# Patient Record
Sex: Female | Born: 1985
Health system: Southern US, Community
[De-identification: ages and names within clinical notes are randomized; demographics above are authoritative.]

## PROBLEM LIST (undated history)

## (undated) DIAGNOSIS — R519 Headache, unspecified: Secondary | ICD-10-CM

## (undated) DIAGNOSIS — F32A Depression, unspecified: Secondary | ICD-10-CM

## (undated) DIAGNOSIS — J449 Chronic obstructive pulmonary disease, unspecified: Secondary | ICD-10-CM

## (undated) DIAGNOSIS — J45909 Unspecified asthma, uncomplicated: Secondary | ICD-10-CM

## (undated) DIAGNOSIS — F329 Major depressive disorder, single episode, unspecified: Secondary | ICD-10-CM

## (undated) DIAGNOSIS — J4489 Other specified chronic obstructive pulmonary disease: Secondary | ICD-10-CM

## (undated) DIAGNOSIS — Z86718 Personal history of other venous thrombosis and embolism: Secondary | ICD-10-CM

## (undated) HISTORY — DX: Headache, unspecified: R51.9

## (undated) HISTORY — DX: Chronic obstructive pulmonary disease, unspecified: J44.9

## (undated) HISTORY — DX: Personal history of other venous thrombosis and embolism: Z86.718

## (undated) HISTORY — DX: Other specified chronic obstructive pulmonary disease: J44.89

## (undated) HISTORY — DX: Unspecified asthma, uncomplicated: J45.909

## (undated) HISTORY — DX: Depression, unspecified: F32.A

---
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
## 2018-10-02 ENCOUNTER — Ambulatory Visit (INDEPENDENT_AMBULATORY_CARE_PROVIDER_SITE_OTHER): Payer: 59 | Admitting: Family Medicine

## 2018-10-02 ENCOUNTER — Encounter: Payer: Self-pay | Admitting: Family Medicine

## 2018-10-02 ENCOUNTER — Other Ambulatory Visit: Payer: Self-pay

## 2018-10-02 DIAGNOSIS — J302 Other seasonal allergic rhinitis: Secondary | ICD-10-CM | POA: Insufficient documentation

## 2018-10-02 DIAGNOSIS — J4520 Mild intermittent asthma, uncomplicated: Secondary | ICD-10-CM | POA: Insufficient documentation

## 2018-10-02 DIAGNOSIS — Z86711 Personal history of pulmonary embolism: Secondary | ICD-10-CM | POA: Diagnosis not present

## 2018-10-02 DIAGNOSIS — Z7689 Persons encountering health services in other specified circumstances: Secondary | ICD-10-CM

## 2018-10-02 MED ORDER — FLUTICASONE PROPIONATE 50 MCG/ACT NA SUSP: 1.0000 | Freq: Every day | NASAL | 0 refills | Status: DC

## 2018-10-02 NOTE — Progress Notes (Signed)
Virtual Visit via Video Note  I connected with Taylor Mccann on 10/02/18 at  8:00 AM EDT by a video enabled telemedicine application and verified that I am speaking with the correct person using two identifiers.  Location patient: home Location provider:work or home office Persons participating in the virtual visit: patient, provider  I discussed the limitations of evaluation and management by telemedicine and the availability of in person appointments. The patient expressed understanding and agreed to proceed.   HPI: Pt is a 33 yo female with pmh sig for asthma, h/o PE, and allergies.  Pt seen at [facility] in [place].  H/o Asthma: -dx'd 2016 -Pt notes hearing her breathing more. Sounds more like in her nose and throat.  -Not having to use the albuterol that much.   -taking singulair for allergies.  -Denies rhinorrhea, sore throat, sinus pressure or pain -endorses intermittent coughing.  H/o PE: -in 2016 -happened after using nuvaring -completed anticoagulation therapy -had f/u in Feb to "check lungs"  H/o irregular menses -in 2017  -went 6 months, then 3 months without a period -now regular  Allergies:   PCN- happened as a child, unsure of rxn.  Past Surgical hx: c-section  Social Hx: Pt recently moved to [place].  Pt is working at [employer].  Getting a master's in Education and an associates in nursing.  Pt has a 34 yo, 34 yo, and 34 yo.  LMP: last month.   Pt denies EtOH, tobacco, or drug use.  ROS: See pertinent positives and negatives per HPI.  Family medical hx: Mom-schizophrenia, bipolar, breathing issues MGM-DM, HTN Oldest brother- preDM   No past medical history on file.  No family history on file.  No current outpatient medications on file.  EXAM:  VITALS per patient if applicable: RR between 39-76 bpm  GENERAL: alert, oriented, appears well and in no acute distress  HEENT: atraumatic, conjunctiva clear, no obvious  abnormalities on inspection of external nose and ears  NECK: normal movements of the head and neck  LUNGS: on inspection no signs of respiratory distress, breathing rate appears normal, no obvious gross SOB, gasping or wheezing  CV: no obvious cyanosis  MS: moves all visible extremities without noticeable abnormality  PSYCH/NEURO: pleasant and cooperative, no obvious depression or anxiety, speech and thought processing grossly intact  ASSESSMENT AND PLAN:  Discussed the following assessment and plan:  Mild intermittent asthma without complication  - Plan: continue albuterol inhaler prn  Seasonal allergies  -symptoms more like transmitted upper airway noises caused by postnasal drainage -continue singulair. -discussed saline nasal rinse, honey, warm fluids, steam, other nasal sprays to help with symptoms - Plan: fluticasone (FLONASE) 50 MCG/ACT nasal spray  Encounter to establish care  -We reviewed the PMH, PSH, FH, SH, Meds and Allergies. -We provided refills for any medications we will prescribe as needed. -We addressed current concerns per orders and patient instructions. -We have asked for records for pertinent exams, studies, vaccines and notes from previous providers. -We have advised patient to follow up per instructions below.  History of pulmonary embolus (PE)  -single occurrence after starting nuvaring -continue to monitor.   f/u prn  I discussed the assessment and treatment plan with the patient. The patient was provided an opportunity to ask questions and all were answered. The patient agreed with the plan and demonstrated an understanding of the instructions.   The patient was advised to call back or seek an in-person evaluation if the symptoms worsen or  if the condition fails to improve as anticipated.   Billie Ruddy, MD

## 2019-09-07 ENCOUNTER — Ambulatory Visit (INDEPENDENT_AMBULATORY_CARE_PROVIDER_SITE_OTHER): Payer: 59 | Admitting: Family Medicine

## 2019-09-07 ENCOUNTER — Encounter: Payer: Self-pay | Admitting: Family Medicine

## 2019-09-07 ENCOUNTER — Other Ambulatory Visit (HOSPITAL_COMMUNITY)
Admission: RE | Admit: 2019-09-07 | Discharge: 2019-09-07 | Disposition: A | Discharge status: Home / Self Care | Payer: 59 | Source: Ambulatory Visit | Attending: Family Medicine | Admitting: Family Medicine

## 2019-09-07 ENCOUNTER — Other Ambulatory Visit: Payer: Self-pay

## 2019-09-07 VITALS — BP 110/82 | HR 79 | Temp 97.8°F | Ht 67.0 in | Wt 261.0 lb

## 2019-09-07 DIAGNOSIS — R0989 Other specified symptoms and signs involving the circulatory and respiratory systems: Secondary | ICD-10-CM

## 2019-09-07 DIAGNOSIS — Z23 Encounter for immunization: Secondary | ICD-10-CM

## 2019-09-07 DIAGNOSIS — N926 Irregular menstruation, unspecified: Secondary | ICD-10-CM | POA: Diagnosis not present

## 2019-09-07 DIAGNOSIS — F32A Depression, unspecified: Secondary | ICD-10-CM

## 2019-09-07 DIAGNOSIS — F419 Anxiety disorder, unspecified: Secondary | ICD-10-CM

## 2019-09-07 DIAGNOSIS — Z0000 Encounter for general adult medical examination without abnormal findings: Secondary | ICD-10-CM | POA: Diagnosis not present

## 2019-09-07 DIAGNOSIS — F329 Major depressive disorder, single episode, unspecified: Secondary | ICD-10-CM

## 2019-09-07 DIAGNOSIS — Z13220 Encounter for screening for lipoid disorders: Secondary | ICD-10-CM

## 2019-09-07 DIAGNOSIS — Z124 Encounter for screening for malignant neoplasm of cervix: Secondary | ICD-10-CM

## 2019-09-07 LAB — CBC WITH DIFFERENTIAL/PLATELET
Basophils Absolute: 0 10*3/uL (ref 0.0–0.1)
Basophils Relative: 0.6 % (ref 0.0–3.0)
Eosinophils Absolute: 0.4 10*3/uL (ref 0.0–0.7)
Eosinophils Relative: 7.3 % — ABNORMAL HIGH (ref 0.0–5.0)
HCT: 38.9 % (ref 36.0–46.0)
Hemoglobin: 13.2 g/dL (ref 12.0–15.0)
Lymphocytes Relative: 25.2 % (ref 12.0–46.0)
Lymphs Abs: 1.4 10*3/uL (ref 0.7–4.0)
MCHC: 34 g/dL (ref 30.0–36.0)
MCV: 85.4 fl (ref 78.0–100.0)
Monocytes Absolute: 0.4 10*3/uL (ref 0.1–1.0)
Monocytes Relative: 6.8 % (ref 3.0–12.0)
Neutro Abs: 3.3 10*3/uL (ref 1.4–7.7)
Neutrophils Relative %: 60.1 % (ref 43.0–77.0)
Platelets: 235 10*3/uL (ref 150.0–400.0)
RBC: 4.55 Mil/uL (ref 3.87–5.11)
RDW: 13.3 % (ref 11.5–15.5)
WBC: 5.5 10*3/uL (ref 4.0–10.5)

## 2019-09-07 LAB — BASIC METABOLIC PANEL
BUN: 12 mg/dL (ref 6–23)
CO2: 28 mEq/L (ref 19–32)
Calcium: 9 mg/dL (ref 8.4–10.5)
Chloride: 104 mEq/L (ref 96–112)
Creatinine, Ser: 1.11 mg/dL (ref 0.40–1.20)
GFR: 67.55 mL/min (ref >60.00)
Glucose, Bld: 98 mg/dL (ref 70–99)
Potassium: 4.2 mEq/L (ref 3.5–5.1)
Sodium: 138 mEq/L (ref 135–145)

## 2019-09-07 LAB — LIPID PANEL
Cholesterol: 204 mg/dL — ABNORMAL HIGH (ref 0–200)
HDL: 37.3 mg/dL — ABNORMAL LOW (ref >39.00)
LDL Cholesterol: 148 mg/dL — ABNORMAL HIGH (ref 0–99)
NonHDL: 166.8
Total CHOL/HDL Ratio: 5
Triglycerides: 96 mg/dL (ref 0.0–149.0)
VLDL: 19.2 mg/dL (ref 0.0–40.0)

## 2019-09-07 LAB — TSH: TSH: 2.78 u[IU]/mL (ref 0.35–4.50)

## 2019-09-07 LAB — T4, FREE: Free T4: 0.7 ng/dL (ref 0.60–1.60)

## 2019-09-07 LAB — HEMOGLOBIN A1C: Hgb A1c MFr Bld: 5.1 % (ref 4.6–6.5)

## 2019-09-07 LAB — POCT URINE PREGNANCY: Preg Test, Ur: NEGATIVE

## 2019-09-07 NOTE — Progress Notes (Signed)
Subjective:     Taylor Mccann is a 34 y.o. female and is here for a comprehensive physical exam. The patient reports problems - irregular menses, decreased mood.  Pt notes irregular menses after nuvaring caused PE in 2016.  LMP 07/24/19.  Before that it was 3 months ago.  Pt also notes weight gain, hirsutism.  Pt may eat one meal per day, trying to work on eating habits.  In the past PCOS mentioned, but never been on meds.  Pt notes decreased mood and energy.  Pt graduates with a master's in 1 wk.  Pt considering nursing school, but needs to take a few prereqs including biology.  Pt tired of being school, but could be happier at work.  Last pap was >3 yrs ago.  Denies abnormal paps.  Social History   Socioeconomic History  . Marital status: Significant Other    Spouse name: Not on file  . Number of children: Not on file  . Years of education: Not on file  . Highest education level: Not on file  Occupational History  . Not on file  Tobacco Use  . Smoking status: Never Smoker  . Smokeless tobacco: Never Used  Substance and Sexual Activity  . Alcohol use: Yes  . Drug use: Never  . Sexual activity: Not on file  Other Topics Concern  . Not on file  Social History Narrative  . Not on file   Social Determinants of Health   Financial Resource Strain:   . Difficulty of Paying Living Expenses:   Food Insecurity:   . Worried About Programme researcher, broadcasting/film/video in the Last Year:   . Barista in the Last Year:   Transportation Needs:   . Freight forwarder (Medical):   Marland Kitchen Lack of Transportation (Non-Medical):   Physical Activity:   . Days of Exercise per Week:   . Minutes of Exercise per Session:   Stress:   . Feeling of Stress :   Social Connections:   . Frequency of Communication with Friends and Family:   . Frequency of Social Gatherings with Friends and Family:   . Attends Religious Services:   . Active Member of Clubs or Organizations:   . Attends Banker Meetings:    Marland Kitchen Marital Status:   Intimate Partner Violence:   . Fear of Current or Ex-Partner:   . Emotionally Abused:   Marland Kitchen Physically Abused:   . Sexually Abused:    Health Maintenance  Topic Date Due  . HIV Screening  Never done  . COVID-19 Vaccine (1) Never done  . PAP SMEAR-Modifier  Never done  . TETANUS/TDAP  10/06/2018  . INFLUENZA VACCINE  11/22/2019    The following portions of the patient's history were reviewed and updated as appropriate: allergies, current medications, past family history, past medical history, past social history, past surgical history and problem list.  Review of Systems Pertinent items noted in HPI and remainder of comprehensive ROS otherwise negative.   Objective:    BP 110/82 (BP Location: Left Arm, Patient Position: Sitting, Cuff Size: Large)   Pulse 79   Temp 97.8 F (36.6 C) (Temporal)   Ht 5\' 7"  (1.702 m)   Wt 261 lb (118.4 kg)   LMP 07/24/2019 (Approximate)   SpO2 97%   BMI 40.88 kg/m  General appearance: alert, cooperative and no distress Head: Normocephalic, without obvious abnormality, atraumatic Eyes: conjunctivae/corneas clear. PERRL, EOM's intact. Fundi benign. Ears: normal TM's and external ear canals both  ears Nose: Nares normal. Septum midline. Mucosa normal. No drainage or sinus tenderness. Throat: lips, mucosa, and tongue normal; teeth and gums normal Neck: no adenopathy, R carotid bruit, no L carotid bruit, no JVD, supple, symmetrical, trachea midline and thyroid not enlarged, symmetric, no tenderness/mass/nodules Lungs: clear to auscultation bilaterally Heart: regular rate and rhythm, S1, S2 normal, no murmur, click, rub or gallop Abdomen: soft, non-tender; bowel sounds normal; no masses,  no organomegaly Pelvic: cervix normal in appearance, external genitalia normal, no adnexal masses or tenderness, no cervical motion tenderness, uterus normal size, shape, and consistency and vaginal normal with thin white d/c Extremities:  extremities normal, atraumatic, no cyanosis or edema Pulses: 2+ and symmetric Skin: Skin color, texture, turgor normal. No rashes or lesions Lymph nodes: Cervical, supraclavicular, and axillary nodes normal. Neurologic: Alert and oriented X 3, normal strength and tone. Normal symmetric reflexes. Normal coordination and gait    Assessment:    Healthy female exam with changes in menses, depression and weight gain     Plan:     Anticipatory guidance given including wearing seatbelts, smoke detectors in the home, increasing physical activity, increasing p.o. intake of water and vegetables. -will obtain labs -pap done done this visit -given handout -next CPE in 1 yr See After Visit Summary for Counseling Recommendations    Irregular menses  -discussed possible causes including PCOS -will obtain labs -discussed starting Metformin daily based on labs -given handout about PCOS - Plan: TSH, T4, Free, Hemoglobin A1c, POCT urine pregnancy  Cervical cancer screening - Plan: PAP [Drew]  Screening for cholesterol level  - Plan: Lipid panel  Anxiety and depression -PHQ 9 score 9 -GAD 7 score 7 -discussed self care including restarting exercise, regular eating -consider counseling -will continue to monitor -Plan: TSH, free T4  Bruit of right carotid artery  - Plan: Lipid panel, US Carotid Duplex Bilateral  Need for diphtheria-tetanus-pertussis (Tdap) vaccine  - Plan: Tdap vaccine greater than or equal to 7yo IM  F/u in 1-2 months  Grier Mitts, MD

## 2019-09-07 NOTE — Patient Instructions (Addendum)
Preventive Care 21-35 Years Old, Female Preventive care refers to visits with your health care provider and lifestyle choices that can promote health and wellness. This includes:  A yearly physical exam. This may also be called an annual well check.  Regular dental visits and eye exams.  Immunizations.  Screening for certain conditions.  Healthy lifestyle choices, such as eating a healthy diet, getting regular exercise, not using drugs or products that contain nicotine and tobacco, and limiting alcohol use. What can I expect for my preventive care visit? Physical exam Your health care provider will check your:  Height and weight. This may be used to calculate body mass index (BMI), which tells if you are at a healthy weight.  Heart rate and blood pressure.  Skin for abnormal spots. Counseling Your health care provider may ask you questions about your:  Alcohol, tobacco, and drug use.  Emotional well-being.  Home and relationship well-being.  Sexual activity.  Eating habits.  Work and work environment.  Method of birth control.  Menstrual cycle.  Pregnancy history. What immunizations do I need?  Influenza (flu) vaccine  This is recommended every year. Tetanus, diphtheria, and pertussis (Tdap) vaccine  You may need a Td booster every 10 years. Varicella (chickenpox) vaccine  You may need this if you have not been vaccinated. Human papillomavirus (HPV) vaccine  If recommended by your health care provider, you may need three doses over 6 months. Measles, mumps, and rubella (MMR) vaccine  You may need at least one dose of MMR. You may also need a second dose. Meningococcal conjugate (MenACWY) vaccine  One dose is recommended if you are age 19-21 years and a first-year college student living in a residence hall, or if you have one of several medical conditions. You may also need additional booster doses. Pneumococcal conjugate (PCV13) vaccine  You may need  this if you have certain conditions and were not previously vaccinated. Pneumococcal polysaccharide (PPSV23) vaccine  You may need one or two doses if you smoke cigarettes or if you have certain conditions. Hepatitis A vaccine  You may need this if you have certain conditions or if you travel or work in places where you may be exposed to hepatitis A. Hepatitis B vaccine  You may need this if you have certain conditions or if you travel or work in places where you may be exposed to hepatitis B. Haemophilus influenzae type b (Hib) vaccine  You may need this if you have certain conditions. You may receive vaccines as individual doses or as more than one vaccine together in one shot (combination vaccines). Talk with your health care provider about the risks and benefits of combination vaccines. What tests do I need?  Blood tests  Lipid and cholesterol levels. These may be checked every 5 years starting at age 20.  Hepatitis C test.  Hepatitis B test. Screening  Diabetes screening. This is done by checking your blood sugar (glucose) after you have not eaten for a while (fasting).  Sexually transmitted disease (STD) testing.  BRCA-related cancer screening. This may be done if you have a family history of breast, ovarian, tubal, or peritoneal cancers.  Pelvic exam and Pap test. This may be done every 3 years starting at age 21. Starting at age 30, this may be done every 5 years if you have a Pap test in combination with an HPV test. Talk with your health care provider about your test results, treatment options, and if necessary, the need for more tests.   Follow these instructions at home: Eating and drinking   Eat a diet that includes fresh fruits and vegetables, whole grains, lean protein, and low-fat dairy.  Take vitamin and mineral supplements as recommended by your health care provider.  Do not drink alcohol if: ? Your health care provider tells you not to drink. ? You are  pregnant, may be pregnant, or are planning to become pregnant.  If you drink alcohol: ? Limit how much you have to 0-1 drink a day. ? Be aware of how much alcohol is in your drink. In the [place] one drink equals one 12 oz bottle of beer (355 mL), one 5 oz glass of wine (148 mL), or one 1 oz glass of hard liquor (44 mL). Lifestyle  Take daily care of your teeth and gums.  Stay active. Exercise for at least 30 minutes on 5 or more days each week.  Do not use any products that contain nicotine or tobacco, such as cigarettes, e-cigarettes, and chewing tobacco. If you need help quitting, ask your health care provider.  If you are sexually active, practice safe sex. Use a condom or other form of birth control (contraception) in order to prevent pregnancy and STIs (sexually transmitted infections). If you plan to become pregnant, see your health care provider for a preconception visit. What's next?  Visit your health care provider once a year for a well check visit.  Ask your health care provider how often you should have your eyes and teeth checked.  Stay up to date on all vaccines. This information is not intended to replace advice given to you by your health care provider. Make sure you discuss any questions you have with your health care provider. Document Revised: 12/19/2017 Document Reviewed: 12/19/2017 Elsevier Patient Education  [place].  Polycystic Ovarian Syndrome  Polycystic ovarian syndrome (PCOS) is a common hormonal disorder among women of reproductive age. In most women with PCOS, many small fluid-filled sacs (cysts) grow on the ovaries, and the cysts are not part of a normal menstrual cycle. PCOS can cause problems with your menstrual periods and make it difficult to get pregnant. It can also cause an increased risk of miscarriage with pregnancy. If it is not treated, PCOS can lead to serious health problems, such as diabetes and heart disease. What are the  causes? The cause of PCOS is not known, but it may be the result of a combination of certain factors, such as:  Irregular menstrual cycle.  High levels of certain hormones (androgens).  Problems with the hormone that helps to control blood sugar (insulin resistance).  Certain genes. What increases the risk? This condition is more likely to develop in women who have a family history of PCOS. What are the signs or symptoms? Symptoms of PCOS may include:  Multiple ovarian cysts.  Infrequent periods or no periods.  Periods that are too frequent or too heavy.  Unpredictable periods.  Inability to get pregnant (infertility) because of not ovulating.  Increased growth of hair on the face, chest, stomach, back, thumbs, thighs, or toes.  Acne or oily skin. Acne may develop during adulthood, and it may not respond to treatment.  Pelvic pain.  Weight gain or obesity.  Patches of thickened and dark brown or black skin on the neck, arms, breasts, or thighs (acanthosis nigricans).  Excess hair growth on the face, chest, abdomen, or upper thighs (hirsutism). How is this diagnosed? This condition is diagnosed based on:  Your medical history.  A  physical exam, including a pelvic exam. Your health care provider may look for areas of increased hair growth on your skin.  Tests, such as: ? Ultrasound. This may be used to examine the ovaries and the lining of the uterus (endometrium) for cysts. ? Blood tests. These may be used to check levels of sugar (glucose), female hormone (testosterone), and female hormones (estrogen and progesterone) in your blood. How is this treated? There is no cure for PCOS, but treatment can help to manage symptoms and prevent more health problems from developing. Treatment varies depending on:  Your symptoms.  Whether you want to have a baby or whether you need birth control (contraception). Treatment may include nutrition and lifestyle changes along  with:  Progesterone hormone to start a menstrual period.  Birth control pills to help you have regular menstrual periods.  Medicines to make you ovulate, if you want to get pregnant.  Medicine to reduce excessive hair growth.  Surgery, in severe cases. This may involve making small holes in one or both of your ovaries. This decreases the amount of testosterone that your body produces. Follow these instructions at home:  Take over-the-counter and prescription medicines only as told by your health care provider.  Follow a healthy meal plan. This can help you reduce the effects of PCOS. ? Eat a healthy diet that includes lean proteins, complex carbohydrates, fresh fruits and vegetables, low-fat dairy products, and healthy fats. Make sure to eat enough fiber.  If you are overweight, lose weight as told by your health care provider. ? Losing 10% of your body weight may improve symptoms. ? Your health care provider can determine how much weight loss is best for you and can help you lose weight safely.  Keep all follow-up visits as told by your health care provider. This is important. Contact a health care provider if:  Your symptoms do not get better with medicine.  You develop new symptoms. This information is not intended to replace advice given to you by your health care provider. Make sure you discuss any questions you have with your health care provider. Document Revised: 03/22/2017 Document Reviewed: 09/25/2015 Elsevier Patient Education  2020 [employer].

## 2019-09-09 LAB — CYTOLOGY - PAP
Adequacy: ABSENT
Comment: NEGATIVE
Comment: NEGATIVE
Diagnosis: NEGATIVE
High risk HPV: NEGATIVE
Trichomonas: NEGATIVE

## 2019-09-10 ENCOUNTER — Other Ambulatory Visit: Payer: Self-pay | Admitting: Family Medicine

## 2019-09-10 DIAGNOSIS — B9689 Other specified bacterial agents as the cause of diseases classified elsewhere: Secondary | ICD-10-CM

## 2019-09-10 MED ORDER — METRONIDAZOLE 500 MG PO TABS: 500.0000 mg | ORAL_TABLET | Freq: Two times a day (BID) | ORAL | 0 refills | Status: AC

## 2019-12-16 ENCOUNTER — Telehealth (INDEPENDENT_AMBULATORY_CARE_PROVIDER_SITE_OTHER): Payer: 59 | Admitting: Family Medicine

## 2019-12-16 ENCOUNTER — Encounter: Payer: Self-pay | Admitting: Family Medicine

## 2019-12-16 DIAGNOSIS — J302 Other seasonal allergic rhinitis: Secondary | ICD-10-CM

## 2019-12-16 DIAGNOSIS — J069 Acute upper respiratory infection, unspecified: Secondary | ICD-10-CM | POA: Diagnosis not present

## 2019-12-16 MED ORDER — FLUTICASONE PROPIONATE 50 MCG/ACT NA SUSP: 1.0000 | Freq: Every day | NASAL | 0 refills | Status: DC

## 2019-12-16 MED ORDER — BENZONATATE 100 MG PO CAPS: 100.0000 mg | ORAL_CAPSULE | Freq: Two times a day (BID) | ORAL | 0 refills | Status: DC | PRN

## 2019-12-16 NOTE — Progress Notes (Signed)
Virtual Visit via Video Note  I connected with Taylor Mccann on 12/16/19 at  2:30 PM EDT by a video enabled telemedicine application 2/2 COVID-19 pandemic and verified that I am speaking with the correct person using two identifiers.  Location patient: home Location provider:work or home office Persons participating in the virtual visit: patient, provider  I discussed the limitations of evaluation and management by telemedicine and the availability of in person appointments. The patient expressed understanding and agreed to proceed.   HPI: Pt is a 34 yo female with pmh sig for asthma, seasonal allergies, h/o PE, h/o depression who was seen for acute concern.   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  On last Friday (5 days ago) pt developed  sore throat, chills, rhinorrhea, congestion, cough-now productive, HA.  Pt had increased SOB the last few days.  Using albuterol inhaler.  Took Aleeve, nyquil, zycam, and another OTC cold med.  Unsure of fever.  Denies facial pain/pressure, n/v, diarrhea.  Sick contacts include a Consulting civil engineer in class.  Pt had a negative COVID test as did the student.    ROS: See pertinent positives and negatives per HPI.  Past Medical History:  Diagnosis Date  . Asthma   . Depression   . Emphysema with chronic bronchitis (HCC)   . Frequent headaches   . H/O blood clots     Past Surgical History:  Procedure Laterality Date  . CESAREAN SECTION      Family History  Problem Relation Age of Onset  . Schizophrenia Mother   . Bipolar disorder Mother   . Diabetes Maternal Grandmother   . Hypertension Maternal Grandmother      Current Outpatient Medications:  .  albuterol (VENTOLIN HFA) 108 (90 Base) MCG/ACT inhaler, Inhale 2 puffs into the lungs every 6 (six) hours as needed for wheezing or shortness of breath., Disp: , Rfl:  .  fluticasone (FLONASE) 50 MCG/ACT nasal spray, Place 1 spray into both nostrils daily., Disp: 16 g, Rfl: 0 .  montelukast  (SINGULAIR) 10 MG tablet, Take 10 mg by mouth at bedtime., Disp: , Rfl:   EXAM:  VITALS per patient if applicable:  RR between 12-20 bpm  GENERAL: alert, oriented, appears well and in no acute distress  HEENT: atraumatic, conjunctiva clear, no obvious abnormalities on inspection of external nose and ears  NECK: normal movements of the head and neck  LUNGS: Productive cough.  On inspection no signs of respiratory distress, breathing rate appears normal, no obvious gross SOB, gasping or wheezing  CV: no obvious cyanosis  MS: moves all visible extremities without noticeable abnormality  PSYCH/NEURO: pleasant and cooperative, no obvious depression or anxiety, speech and thought processing grossly intact  ASSESSMENT AND PLAN:  Discussed the following assessment and plan:  Viral URI with cough -Discussed possible causes.  Less likely strep pharyngitis 2/2 history of cough and other symptoms.  Also consider flu. -Recent Covid test negative. -Continue supportive care -Send in prescription for Tessalon and Flonase -Pt encouraged to continue using albuterol inhaler as needed -Given precautions -Patient advised to contact clinic on Friday for continued or worsening symptoms.  At that time consider prednisone and antibiotic for possible bronchitis - Plan: fluticasone (FLONASE) 50 MCG/ACT nasal spray, benzonatate (TESSALON) 100 MG capsule  Seasonal allergies  - Plan: fluticasone (FLONASE) 50 MCG/ACT nasal spray   Given note for work.  Follow-up as needed   I discussed the assessment and treatment plan with the patient. The patient  was provided an opportunity to ask questions and all were answered. The patient agreed with the plan and demonstrated an understanding of the instructions.   The patient was advised to call back or seek an in-person evaluation if the symptoms worsen or if the condition fails to improve as anticipated.  I provided 11 minutes of non-face-to-face time during  this encounter.   Deeann Saint, MD

## 2020-01-11 ENCOUNTER — Encounter: Payer: Self-pay | Admitting: Family Medicine

## 2020-01-13 ENCOUNTER — Telehealth: Payer: Self-pay | Admitting: Family Medicine

## 2020-01-13 ENCOUNTER — Telehealth: Payer: 59 | Admitting: Family Medicine
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
## 2020-01-20 ENCOUNTER — Ambulatory Visit: Payer: 59 | Admitting: Family Medicine

## 2020-02-19 ENCOUNTER — Encounter: Payer: Self-pay | Admitting: Family Medicine

## 2020-03-14 ENCOUNTER — Ambulatory Visit (HOSPITAL_COMMUNITY)
Admission: RE | Admit: 2020-03-14 | Discharge: 2020-03-14 | Disposition: A | Discharge status: Home / Self Care | Payer: BC Managed Care – PPO | Source: Ambulatory Visit | Attending: Internal Medicine | Admitting: Internal Medicine

## 2020-03-14 DIAGNOSIS — R0989 Other specified symptoms and signs involving the circulatory and respiratory systems: Secondary | ICD-10-CM | POA: Insufficient documentation

## 2020-03-23 ENCOUNTER — Ambulatory Visit: Payer: BC Managed Care – PPO | Admitting: Family Medicine

## 2020-09-25 ENCOUNTER — Emergency Department (HOSPITAL_BASED_OUTPATIENT_CLINIC_OR_DEPARTMENT_OTHER)
Admission: EM | Admit: 2020-09-25 | Discharge: 2020-09-26 | Disposition: A | Discharge status: Home / Self Care | Payer: 59 | Attending: Emergency Medicine | Admitting: Emergency Medicine

## 2020-09-25 ENCOUNTER — Other Ambulatory Visit: Payer: Self-pay

## 2020-09-25 ENCOUNTER — Encounter (HOSPITAL_BASED_OUTPATIENT_CLINIC_OR_DEPARTMENT_OTHER): Payer: Self-pay

## 2020-09-25 DIAGNOSIS — Z20822 Contact with and (suspected) exposure to covid-19: Secondary | ICD-10-CM | POA: Insufficient documentation

## 2020-09-25 DIAGNOSIS — J4521 Mild intermittent asthma with (acute) exacerbation: Secondary | ICD-10-CM | POA: Diagnosis not present

## 2020-09-25 DIAGNOSIS — R0602 Shortness of breath: Secondary | ICD-10-CM | POA: Diagnosis present

## 2020-09-25 DIAGNOSIS — Z7951 Long term (current) use of inhaled steroids: Secondary | ICD-10-CM | POA: Insufficient documentation

## 2020-09-25 MED ORDER — IPRATROPIUM-ALBUTEROL 0.5-2.5 (3) MG/3ML IN SOLN
RESPIRATORY_TRACT | Status: AC
  Administered 2020-09-25: 3 mL via RESPIRATORY_TRACT
  Filled 2020-09-25: qty 3

## 2020-09-25 MED ORDER — IPRATROPIUM-ALBUTEROL 0.5-2.5 (3) MG/3ML IN SOLN: 3.0000 mL | Freq: Once | RESPIRATORY_TRACT | Status: AC

## 2020-09-25 MED ORDER — PREDNISONE 50 MG PO TABS
60.0000 mg | ORAL_TABLET | Freq: Once | ORAL | Status: AC
  Administered 2020-09-25: 60 mg via ORAL
  Filled 2020-09-25: qty 1

## 2020-09-25 MED ORDER — IPRATROPIUM-ALBUTEROL 20-100 MCG/ACT IN AERS
1.0000 | INHALATION_SPRAY | Freq: Four times a day (QID) | RESPIRATORY_TRACT | Status: DC | PRN
  Filled 2020-09-25: qty 4

## 2020-09-25 MED ORDER — ALBUTEROL SULFATE HFA 108 (90 BASE) MCG/ACT IN AERS: 2.0000 | INHALATION_SPRAY | Freq: Four times a day (QID) | RESPIRATORY_TRACT | 0 refills | Status: DC | PRN

## 2020-09-25 MED ORDER — IPRATROPIUM-ALBUTEROL 0.5-2.5 (3) MG/3ML IN SOLN
3.0000 mL | Freq: Once | RESPIRATORY_TRACT | Status: AC
  Administered 2020-09-25: 3 mL via RESPIRATORY_TRACT
  Filled 2020-09-25: qty 3

## 2020-09-25 MED ORDER — PREDNISONE 50 MG PO TABS: 50.0000 mg | ORAL_TABLET | Freq: Every day | ORAL | 0 refills | Status: DC

## 2020-09-25 NOTE — ED Triage Notes (Addendum)
Pt is present for SOB due to asthma since last night. Pt states she has used her inhaler with minimal relief of sx. Denies N/V/D or recent exposure to COVID. 97% RA during triage.

## 2020-09-25 NOTE — ED Provider Notes (Signed)
[facility] EMERGENCY DEPT Provider Note   CSN: [id number] Arrival date & time: 09/25/20  2217     History Chief Complaint  Patient presents with  . Shortness of Breath  . Asthma    Taylor Mccann is a 35 y.o. female.  HPI   Patient presents ED for evaluation of shortness of breath.  Patient has history of asthma.  She states she started having an exacerbation this evening.  The symptoms became severe and she felt like she was struggling to breathe .  She has been using her home medications without relief.  She denies any fevers or chills.  No chest pain.  No leg swelling.  She did feel chilled earlier but has not measured her temperature.  Past Medical History:  Diagnosis Date  . Asthma   . Depression   . Emphysema with chronic bronchitis (HCC)   . Frequent headaches   . H/O blood clots     Patient Active Problem List   Diagnosis Date Noted  . History of pulmonary embolus (PE) 10/02/2018  . Seasonal allergies 10/02/2018  . Mild intermittent asthma without complication 10/02/2018    Past Surgical History:  Procedure Laterality Date  . CESAREAN SECTION       OB History   No obstetric history on file.     Family History  Problem Relation Age of Onset  . Schizophrenia Mother   . Bipolar disorder Mother   . Diabetes Maternal Grandmother   . Hypertension Maternal Grandmother     Social History   Tobacco Use  . Smoking status: Never Smoker  . Smokeless tobacco: Never Used  Substance Use Topics  . Alcohol use: Yes  . Drug use: Never    Home Medications Prior to Admission medications   Medication Sig Start Date End Date Taking? Authorizing Provider  albuterol (VENTOLIN HFA) 108 (90 Base) MCG/ACT inhaler Inhale 2 puffs into the lungs every 6 (six) hours as needed for wheezing or shortness of breath.    [provider]  benzonatate (TESSALON) 100 MG capsule Take 1 capsule (100 mg total) by mouth 2 (two) times daily as needed for  cough. 12/16/19   Deeann Saint, MD  fluticasone (FLONASE) 50 MCG/ACT nasal spray Place 1 spray into both nostrils daily. 12/16/19   Deeann Saint, MD  montelukast (SINGULAIR) 10 MG tablet Take 10 mg by mouth at bedtime.    [provider]    Allergies    Penicillins and Penicillins  Review of Systems   Review of Systems  All other systems reviewed and are negative.   Physical Exam Updated Vital Signs BP (!) 122/58   Pulse (!) 114   Temp 99.6 F (37.6 C) (Oral)   Resp (!) 98   Ht 1.676 m (5\' 6" )   Wt 113.6 kg   LMP 09/24/2020   SpO2 98%   BMI 40.42 kg/m   Physical Exam Vitals and nursing note reviewed.  Constitutional:      Appearance: She is well-developed. She is not diaphoretic.  HENT:     Head: Normocephalic and atraumatic.     Right Ear: External ear normal.     Left Ear: External ear normal.  Eyes:     General: No scleral icterus.       Right eye: No discharge.        Left eye: No discharge.     Conjunctiva/sclera: Conjunctivae normal.  Neck:     Trachea: No tracheal deviation.  Cardiovascular:  Rate and Rhythm: Normal rate and regular rhythm.  Pulmonary:     Effort: Pulmonary effort is normal. Tachypnea present. No respiratory distress.     Breath sounds: No stridor. Wheezing present. No rales.  Abdominal:     General: Bowel sounds are normal. There is no distension.     Palpations: Abdomen is soft.     Tenderness: There is no abdominal tenderness. There is no guarding or rebound.  Musculoskeletal:        General: No tenderness.     Cervical back: Neck supple.  Skin:    General: Skin is warm and dry.     Findings: No rash.  Neurological:     Mental Status: She is alert.     Cranial Nerves: No cranial nerve deficit (no facial droop, extraocular movements intact, no slurred speech).     Sensory: No sensory deficit.     Motor: No abnormal muscle tone or seizure activity.     Coordination: Coordination normal.     ED Results /  Procedures / Treatments   Labs (all labs ordered are listed, but only abnormal results are displayed) Labs Reviewed - No data to display  EKG None  Radiology No results found.  Procedures Procedures   Medications Ordered in ED Medications  ipratropium-albuterol (DUONEB) 0.5-2.5 (3) MG/3ML nebulizer solution 3 mL (has no administration in time range)  predniSONE (DELTASONE) tablet 60 mg (has no administration in time range)  ipratropium-albuterol (DUONEB) 0.5-2.5 (3) MG/3ML nebulizer solution 3 mL (3 mLs Nebulization Given 09/25/20 2238)    ED Course  I have reviewed the triage vital signs and the nursing notes.  Pertinent labs & imaging results that were available during my care of the patient were reviewed by me and considered in my medical decision making (see chart for details).   MDM Rules/Calculators/A&P                          Pt presented with wheezing shortness of breath.  Given a neb treatment prior to my eval but still with some wheezing on exam.  Additional neb treatment given, prednisone ordered.  Pt able to speak in full sentences.  Care turned over to Dr Wilkie Aye, anticipate dc home if she continues to improve Final Clinical Impression(s) / ED Diagnoses Final diagnoses:  Exacerbation of intermittent asthma, unspecified asthma severity    Rx / DC Orders ED Discharge Orders    None       Linwood Dibbles, MD 09/27/20 1106

## 2020-09-26 LAB — SARS CORONAVIRUS 2 (TAT 6-24 HRS): SARS Coronavirus 2: NEGATIVE

## 2020-09-26 MED ORDER — IPRATROPIUM-ALBUTEROL 0.5-2.5 (3) MG/3ML IN SOLN
3.0000 mL | Freq: Once | RESPIRATORY_TRACT | Status: AC
  Administered 2020-09-26: 3 mL via RESPIRATORY_TRACT
  Filled 2020-09-26: qty 3

## 2020-09-26 NOTE — ED Provider Notes (Signed)
  Patient signed out pending recheck.  In brief history of asthma presented with wheezing.  Prior to my evaluation had received 2 DuoNeb's and a dose of steroids.  See clinical course below.  Patient clinically significantly improved.  Requested COVID testing which was sent.  Physical Exam  BP 130/76   Pulse (!) 113   Temp 99.6 F (37.6 C) (Oral)   Resp 18   Ht 1.676 m (5\' 6" )   Wt 113.6 kg   LMP 09/24/2020   SpO2 98%   BMI 40.42 kg/m   Physical Exam Awake, alert, no acute distress Slight tachypnea, fair air movement with faint expiratory squeak ED Course/Procedures   Clinical Course as of 09/26/20 0042  Mon Sep 26, 2020  0007 Patient reports overall clinical improvement although now she states that she has been coughing which makes her feel more short of breath.  She is moving air on exam but is slightly tachypneic.  She still has a faint expiratory squeak.  Will provide 1 more DuoNeb and reassess. [CH]  0040 Patient ambulated and maintained pulse ox 92 to 94%.  She was requesting COVID testing although she has been afebrile.  24-hour COVID testing sent.  Patient clinically improved and will be discharged per Dr. 0008 discharge instructions. [CH]    Clinical Course User Index [CH] Ceclia Koker, Delford Field, MD    Procedures  MDM    Problem List Items Addressed This Visit   None   Visit Diagnoses    Exacerbation of intermittent asthma, unspecified asthma severity    -  Primary   Relevant Medications   ipratropium-albuterol (DUONEB) 0.5-2.5 (3) MG/3ML nebulizer solution 3 mL (Completed)   ipratropium-albuterol (DUONEB) 0.5-2.5 (3) MG/3ML nebulizer solution 3 mL (Completed)   predniSONE (DELTASONE) tablet 60 mg (Completed)   albuterol (VENTOLIN HFA) 108 (90 Base) MCG/ACT inhaler   predniSONE (DELTASONE) 50 MG tablet   Ipratropium-Albuterol (COMBIVENT) respimat 1 puff   ipratropium-albuterol (DUONEB) 0.5-2.5 (3) MG/3ML nebulizer solution 3 mL (Completed)            Imo Cumbie, Mayer Masker, MD 09/26/20 [phone]

## 2020-09-26 NOTE — ED Notes (Signed)
PT ambulated to bathroom with mobile pulse oximetry. While Pt ambulated Pt 02 median was 92% on RA to restroom and 94% on return to PT room. Dr. Wilkie Aye notified.

## 2020-09-26 NOTE — ED Notes (Signed)
RT educated pt on proper use of new respiratory medication combivent respimat. Pt able to explain to RT proper use of medication. Pt also educated on importance of close monitoring of asthma symptoms, causes/triggers to help better manage her condition.

## 2020-09-26 NOTE — Discharge Instructions (Addendum)
A prescription for a refill on your inhaler and steroids has been sent to the pharmacy.  Your COVID test was sent and will result in the next 12 to 24 hours.  Check MyChart for results.

## 2020-09-26 NOTE — ED Notes (Signed)
Pt verbalizes understanding of discharge instructions. Opportunity for questioning and answers were provided. Armand removed by staff, pt discharged from ED to home. Instructed to log into Mychart for COVID results. Use inhalers as directed.

## 2020-09-30 ENCOUNTER — Ambulatory Visit: Payer: 59 | Admitting: Family Medicine

## 2020-10-14 ENCOUNTER — Encounter: Payer: Self-pay | Admitting: Family Medicine

## 2020-10-17 NOTE — Telephone Encounter (Signed)
Pt picked up records.

## 2020-11-10 ENCOUNTER — Ambulatory Visit (INDEPENDENT_AMBULATORY_CARE_PROVIDER_SITE_OTHER): Payer: 59 | Admitting: Family Medicine

## 2020-11-10 ENCOUNTER — Encounter: Payer: Self-pay | Admitting: Family Medicine

## 2020-11-10 ENCOUNTER — Other Ambulatory Visit: Payer: Self-pay

## 2020-11-10 VITALS — BP 130/80 | HR 75 | Temp 98.2°F | Ht 67.0 in | Wt 260.0 lb

## 2020-11-10 DIAGNOSIS — J302 Other seasonal allergic rhinitis: Secondary | ICD-10-CM | POA: Diagnosis not present

## 2020-11-10 DIAGNOSIS — E782 Mixed hyperlipidemia: Secondary | ICD-10-CM

## 2020-11-10 DIAGNOSIS — J4520 Mild intermittent asthma, uncomplicated: Secondary | ICD-10-CM

## 2020-11-10 DIAGNOSIS — Z6841 Body Mass Index (BMI) 40.0 and over, adult: Secondary | ICD-10-CM

## 2020-11-10 DIAGNOSIS — Z0000 Encounter for general adult medical examination without abnormal findings: Secondary | ICD-10-CM

## 2020-11-10 LAB — COMPREHENSIVE METABOLIC PANEL
ALT: 42 U/L — ABNORMAL HIGH (ref 0–35)
AST: 35 U/L (ref 0–37)
Albumin: 4 g/dL (ref 3.5–5.2)
Alkaline Phosphatase: 58 U/L (ref 39–117)
BUN: 11 mg/dL (ref 6–23)
CO2: 26 mEq/L (ref 19–32)
Calcium: 9.4 mg/dL (ref 8.4–10.5)
Chloride: 103 mEq/L (ref 96–112)
Creatinine, Ser: 1.12 mg/dL (ref 0.40–1.20)
GFR: 63.26 mL/min (ref >60.00)
Glucose, Bld: 83 mg/dL (ref 70–99)
Potassium: 4.3 mEq/L (ref 3.5–5.1)
Sodium: 138 mEq/L (ref 135–145)
Total Bilirubin: 0.6 mg/dL (ref 0.2–1.2)
Total Protein: 7 g/dL (ref 6.0–8.3)

## 2020-11-10 LAB — VITAMIN D 25 HYDROXY (VIT D DEFICIENCY, FRACTURES): VITD: 10.34 ng/mL — ABNORMAL LOW (ref 30.00–100.00)

## 2020-11-10 LAB — LIPID PANEL
Cholesterol: 217 mg/dL — ABNORMAL HIGH (ref 0–200)
HDL: 41.2 mg/dL (ref >39.00)
LDL Cholesterol: 156 mg/dL — ABNORMAL HIGH (ref 0–99)
NonHDL: 175.33
Total CHOL/HDL Ratio: 5
Triglycerides: 95 mg/dL (ref 0.0–149.0)
VLDL: 19 mg/dL (ref 0.0–40.0)

## 2020-11-10 LAB — CBC WITH DIFFERENTIAL/PLATELET
Basophils Absolute: 0.1 10*3/uL (ref 0.0–0.1)
Basophils Relative: 1.3 % (ref 0.0–3.0)
Eosinophils Absolute: 0.6 10*3/uL (ref 0.0–0.7)
Eosinophils Relative: 13.3 % — ABNORMAL HIGH (ref 0.0–5.0)
HCT: 39.8 % (ref 36.0–46.0)
Hemoglobin: 13.2 g/dL (ref 12.0–15.0)
Lymphocytes Relative: 32.7 % (ref 12.0–46.0)
Lymphs Abs: 1.5 10*3/uL (ref 0.7–4.0)
MCHC: 33.2 g/dL (ref 30.0–36.0)
MCV: 84.4 fl (ref 78.0–100.0)
Monocytes Absolute: 0.3 10*3/uL (ref 0.1–1.0)
Monocytes Relative: 7.2 % (ref 3.0–12.0)
Neutro Abs: 2.1 10*3/uL (ref 1.4–7.7)
Neutrophils Relative %: 45.5 % (ref 43.0–77.0)
Platelets: 268 10*3/uL (ref 150.0–400.0)
RBC: 4.71 Mil/uL (ref 3.87–5.11)
RDW: 13.7 % (ref 11.5–15.5)
WBC: 4.7 10*3/uL (ref 4.0–10.5)

## 2020-11-10 LAB — TSH: TSH: 2.2 u[IU]/mL (ref 0.35–5.50)

## 2020-11-10 LAB — HEMOGLOBIN A1C: Hgb A1c MFr Bld: 5.4 % (ref 4.6–6.5)

## 2020-11-10 LAB — T4, FREE: Free T4: 0.66 ng/dL (ref 0.60–1.60)

## 2020-11-10 MED ORDER — ALBUTEROL SULFATE HFA 108 (90 BASE) MCG/ACT IN AERS: 2.0000 | INHALATION_SPRAY | Freq: Four times a day (QID) | RESPIRATORY_TRACT | 3 refills | Status: DC | PRN

## 2020-11-10 MED ORDER — FLUTICASONE PROPIONATE 50 MCG/ACT NA SUSP: 1.0000 | Freq: Every day | NASAL | 3 refills | Status: DC

## 2020-11-10 NOTE — Progress Notes (Signed)
Subjective:     Taylor Mccann is a 35 y.o. female and is here for a comprehensive physical exam. The patient reports difficulty losing weight.  Exercising at gym several times per week.  Drinking lots of water daily.  May eat 1 or 2 meals per day.  Typically does not eat breakfast.  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  Patient's partner cooks baked chicken and healthy meals.  Social History   Socioeconomic History   Marital status: Significant Other    Spouse name: Not on file   Number of children: Not on file   Years of education: Not on file   Highest education level: Not on file  Occupational History   Not on file  Tobacco Use   Smoking status: Never   Smokeless tobacco: Never  Substance and Sexual Activity   Alcohol use: Yes   Drug use: Never   Sexual activity: Not on file  Other Topics Concern   Not on file  Social History Narrative   Not on file   Social Determinants of Health   Financial Resource Strain: Not on file  Food Insecurity: Not on file  Transportation Needs: Not on file  Physical Activity: Not on file  Stress: Not on file  Social Connections: Not on file  Intimate Partner Violence: Not on file   Health Maintenance  Topic Date Due   HIV Screening  Never done   Hepatitis C Screening  Never done   COVID-19 Vaccine (3 - Booster for Moderna series) 07/14/2020   INFLUENZA VACCINE  11/21/2020   PAP SMEAR-Modifier  09/07/2022   TETANUS/TDAP  09/06/2029   Pneumococcal Vaccine 81-36 Years old  Aged Out   HPV VACCINES  Aged Out    The following portions of the patient's history were reviewed and updated as appropriate: allergies, current medications, past family history, past medical history, past social history, past surgical history, and problem list.  Review of Systems Pertinent items noted in HPI and remainder of comprehensive ROS otherwise negative.   Objective:    BP 130/80 (BP Location: Left Arm,  Patient Position: Sitting, Cuff Size: Normal)   Pulse 75   Temp 98.2 F (36.8 C) (Oral)   Ht 5\' 7"  (1.702 m)   Wt 260 lb (117.9 kg)   LMP 10/18/2020   SpO2 91%   BMI 40.72 kg/m  General appearance: alert, cooperative, and no distress Head: Normocephalic, without obvious abnormality, atraumatic Eyes: conjunctivae/corneas clear. PERRL, EOM's intact. Fundi benign. Ears: normal TM's and external ear canals both ears Nose: Nares normal. Septum midline. Mucosa normal. No drainage or sinus tenderness. Throat: lips, mucosa, and tongue normal; teeth and gums normal Neck: no adenopathy, no carotid bruit, no JVD, supple, symmetrical, trachea midline, and thyroid not enlarged, symmetric, no tenderness/mass/nodules Lungs: clear to auscultation bilaterally Heart: regular rate and rhythm, S1, S2 normal, no murmur, click, rub or gallop Abdomen: soft, non-tender; bowel sounds normal; no masses,  no organomegaly Extremities: extremities normal, atraumatic, no cyanosis or edema Pulses: 2+ and symmetric Skin: Skin color, texture, turgor normal. No rashes or lesions Lymph nodes: Cervical, supraclavicular, and axillary nodes normal. Neurologic: Alert and oriented X 3, normal strength and tone. Normal symmetric reflexes. Normal coordination and gait    Assessment:    Healthy female exam.      Plan:    Anticipatory guidance given including wearing seatbelts, smoke detectors in the home, increasing physical activity, increasing p.o. intake of water and vegetables. -PHQ-9 score  7 -GAD-7 score 10 -Self-care -obtain labs -Given handout -Pap due 2023 -Next CPE in 1 year See After Visit Summary for Counseling Recommendations   Class 3 severe obesity due to excess calories without serious comorbidity with body mass index (BMI) of 40.0 to 44.9 in adult [facility]) -Discussed possible causes that may make it difficult to lose weight including PCOS, metabolic syndrome -Discussed eating several small meals per  day, decreasing calorie intake, and increasing physical activity - Plan: TSH, T4, Free, Hemoglobin A1c, CMP, Vitamin D, 25-hydroxy  Seasonal allergies -Discussed using daily OTC allergy medication such as Xyzal, Claritin, Zyrtec, Allegra -Can also use nasal rinse or Flonase - Plan: CBC with Differential/Platelet, fluticasone (FLONASE) 50 MCG/ACT nasal spray  Mixed hyperlipidemia -Lifestyle modifications - Plan: Lipid panel  Mild intermittent asthma without complication -Stable -Likely exacerbated by allergies.  Discussed controlling allergies with OTC allergy medication and Flonase - Plan: albuterol (VENTOLIN HFA) 108 (90 Base) MCG/ACT inhaler  F/u in 1-2 months  Abbe Amsterdam, MD

## 2020-11-11 ENCOUNTER — Other Ambulatory Visit: Payer: Self-pay | Admitting: Family Medicine

## 2020-11-11 ENCOUNTER — Encounter: Payer: Self-pay | Admitting: Family Medicine

## 2020-11-11 DIAGNOSIS — E559 Vitamin D deficiency, unspecified: Secondary | ICD-10-CM | POA: Insufficient documentation

## 2020-11-11 MED ORDER — VITAMIN D (ERGOCALCIFEROL) 1.25 MG (50000 UNIT) PO CAPS: 50000.0000 [IU] | ORAL_CAPSULE | ORAL | 0 refills | Status: DC

## 2021-01-11 ENCOUNTER — Ambulatory Visit: Payer: 59 | Admitting: Family Medicine

## 2021-02-15 ENCOUNTER — Encounter: Payer: Self-pay | Admitting: Family Medicine

## 2021-02-15 ENCOUNTER — Telehealth (INDEPENDENT_AMBULATORY_CARE_PROVIDER_SITE_OTHER): Payer: BC Managed Care – PPO | Admitting: Family Medicine

## 2021-02-15 DIAGNOSIS — J069 Acute upper respiratory infection, unspecified: Secondary | ICD-10-CM | POA: Diagnosis not present

## 2021-02-15 DIAGNOSIS — J4541 Moderate persistent asthma with (acute) exacerbation: Secondary | ICD-10-CM

## 2021-02-15 MED ORDER — PREDNISONE 20 MG PO TABS
60.0000 mg | ORAL_TABLET | Freq: Every day | ORAL | 0 refills | Status: AC
Start: 2021-02-15 — End: 2021-02-19

## 2021-02-15 MED ORDER — BENZONATATE 100 MG PO CAPS: 100.0000 mg | ORAL_CAPSULE | Freq: Two times a day (BID) | ORAL | 0 refills | Status: DC | PRN

## 2021-02-15 NOTE — Progress Notes (Signed)
Virtual Visit via Video Note  I connected with Taylor Mccann on 02/15/21 at  4:00 PM EDT by a video enabled telemedicine application 2/2 COVID-19 pandemic and verified that I am speaking with the correct person using two identifiers.  Location patient: home Location provider:work or home office Persons participating in the virtual visit: patient, provider  I discussed the limitations of evaluation and management by telemedicine and the availability of in person appointments. The patient expressed understanding and agreed to proceed. Chief Complaint  Patient presents with   Nasal Congestion    Has SOB, congestion, and has started loosing her voice. Started Friday with sore throat, Saturday she states she could not get out of bed. Has tried any and all OTC cough syrups and medications she could possibly take.     HPI: Pt developed sore throat on Friday.  Developed SOB, productive cough with green phlegm, HA, chills, wheezing.  Denies fever, ear pain or pressure, facial pain/pressure, n/v.  Unsure about sick contacts.  Pt was sent home from work on Monday and has been out since.  Tried Mucinex, robitussin, nyquil, and alka seltzer.  Using Albuterol inhaler q 2 hrs.  ROS: See pertinent positives and negatives per HPI.  Past Medical History:  Diagnosis Date   Asthma    Depression    Emphysema with chronic bronchitis (HCC)    Frequent headaches    H/O blood clots     Past Surgical History:  Procedure Laterality Date   CESAREAN SECTION      Family History  Problem Relation Age of Onset   Schizophrenia Mother    Bipolar disorder Mother    Diabetes Maternal Grandmother    Hypertension Maternal Grandmother      Current Outpatient Medications:    albuterol (VENTOLIN HFA) 108 (90 Base) MCG/ACT inhaler, Inhale 2 puffs into the lungs every 6 (six) hours as needed for wheezing or shortness of breath., Disp: 18 g, Rfl: 3   fluticasone (FLONASE) 50 MCG/ACT nasal spray, Place 1 spray into  both nostrils daily., Disp: 16 g, Rfl: 3   montelukast (SINGULAIR) 10 MG tablet, Take 10 mg by mouth at bedtime., Disp: , Rfl:    predniSONE (DELTASONE) 50 MG tablet, Take 1 tablet (50 mg total) by mouth daily., Disp: 5 tablet, Rfl: 0   Vitamin D, Ergocalciferol, (DRISDOL) 1.25 MG (50000 UNIT) CAPS capsule, Take 1 capsule (50,000 Units total) by mouth every 7 (seven) days., Disp: 12 capsule, Rfl: 0  EXAM:  VITALS per patient if applicable: RR between 12-20 bpm  GENERAL: alert, oriented, appears well and in no acute distress  HEENT: atraumatic, conjunctiva clear, no obvious abnormalities on inspection of external nose and ears  NECK: normal movements of the head and neck  LUNGS: Intermittent cough, on inspection no signs of respiratory distress, breathing rate appears normal, no obvious gross SOB, gasping or wheezing  CV: no obvious cyanosis  MS: moves all visible extremities without noticeable abnormality  PSYCH/NEURO: pleasant and cooperative, no obvious depression or anxiety, speech and thought processing grossly intact  ASSESSMENT AND PLAN:  Discussed the following assessment and plan:  Viral URI with cough  -Symptoms likely 2/2 viral etiology.  Concern for progression into pneumonia given increased SOB with productive cough -COVID testing advised. -continue supportive care -will obtain CXR in am -given strict precautions - Plan: DG Chest 2 View, benzonatate (TESSALON) 100 MG capsule, POC COVID test  Moderate persistent asthma with acute exacerbation  -advised on recommended albuterol use -given strict precautions  for increased symptoms - Plan: DG Chest 2 View, predniSONE (DELTASONE) 20 MG tablet, POC COVID test  F/u prn.  CXR in am, further recs based on results.   I discussed the assessment and treatment plan with the patient. The patient was provided an opportunity to ask questions and all were answered. The patient agreed with the plan and demonstrated an  understanding of the instructions.   The patient was advised to call back or seek an in-person evaluation if the symptoms worsen or if the condition fails to improve as anticipated.   Deeann Saint, MD

## 2021-02-16 ENCOUNTER — Encounter: Payer: Self-pay | Admitting: Family Medicine

## 2021-02-16 ENCOUNTER — Other Ambulatory Visit: Payer: Self-pay

## 2021-02-16 ENCOUNTER — Other Ambulatory Visit: Payer: BC Managed Care – PPO

## 2021-02-16 ENCOUNTER — Ambulatory Visit (INDEPENDENT_AMBULATORY_CARE_PROVIDER_SITE_OTHER): Payer: BC Managed Care – PPO

## 2021-02-16 DIAGNOSIS — J069 Acute upper respiratory infection, unspecified: Secondary | ICD-10-CM | POA: Diagnosis not present

## 2021-02-16 DIAGNOSIS — J4541 Moderate persistent asthma with (acute) exacerbation: Secondary | ICD-10-CM | POA: Diagnosis not present

## 2021-02-18 ENCOUNTER — Telehealth: Payer: Self-pay | Admitting: Family Medicine

## 2021-02-18 ENCOUNTER — Other Ambulatory Visit: Payer: Self-pay | Admitting: Family Medicine

## 2021-02-18 DIAGNOSIS — J189 Pneumonia, unspecified organism: Secondary | ICD-10-CM

## 2021-02-18 MED ORDER — AZITHROMYCIN 250 MG PO TABS: ORAL_TABLET | ORAL | 0 refills | Status: AC

## 2021-02-18 NOTE — Telephone Encounter (Signed)
Pt called regarding CXR results concerning for bibasilar and perihilar pna.  Rx for Azithromycin sent to pharmacy.  Message also sent on mychart.  Abbe Amsterdam, MD

## 2021-03-18 ENCOUNTER — Emergency Department (HOSPITAL_BASED_OUTPATIENT_CLINIC_OR_DEPARTMENT_OTHER)
Admission: EM | Admit: 2021-03-18 | Discharge: 2021-03-18 | Disposition: A | Discharge status: Home / Self Care | Payer: BC Managed Care – PPO | Attending: Emergency Medicine | Admitting: Emergency Medicine

## 2021-03-18 ENCOUNTER — Emergency Department (HOSPITAL_BASED_OUTPATIENT_CLINIC_OR_DEPARTMENT_OTHER): Payer: BC Managed Care – PPO | Admitting: Radiology

## 2021-03-18 ENCOUNTER — Encounter (HOSPITAL_BASED_OUTPATIENT_CLINIC_OR_DEPARTMENT_OTHER): Payer: Self-pay | Admitting: *Deleted

## 2021-03-18 ENCOUNTER — Other Ambulatory Visit: Payer: Self-pay

## 2021-03-18 DIAGNOSIS — B349 Viral infection, unspecified: Secondary | ICD-10-CM

## 2021-03-18 DIAGNOSIS — Z20822 Contact with and (suspected) exposure to covid-19: Secondary | ICD-10-CM | POA: Diagnosis not present

## 2021-03-18 DIAGNOSIS — J101 Influenza due to other identified influenza virus with other respiratory manifestations: Secondary | ICD-10-CM | POA: Diagnosis not present

## 2021-03-18 DIAGNOSIS — J9801 Acute bronchospasm: Secondary | ICD-10-CM | POA: Insufficient documentation

## 2021-03-18 DIAGNOSIS — R0602 Shortness of breath: Secondary | ICD-10-CM | POA: Diagnosis present

## 2021-03-18 LAB — RESP PANEL BY RT-PCR (FLU A&B, COVID) ARPGX2
Influenza A by PCR: POSITIVE — AB
Influenza B by PCR: NEGATIVE
SARS Coronavirus 2 by RT PCR: NEGATIVE

## 2021-03-18 MED ORDER — PREDNISONE 20 MG PO TABS: ORAL_TABLET | ORAL | 0 refills | Status: DC

## 2021-03-18 MED ORDER — OSELTAMIVIR PHOSPHATE 75 MG PO CAPS
75.0000 mg | ORAL_CAPSULE | Freq: Once | ORAL | Status: AC
  Administered 2021-03-18: 75 mg via ORAL
  Filled 2021-03-18: qty 1

## 2021-03-18 MED ORDER — AEROCHAMBER PLUS FLO-VU MISC
1.0000 | Freq: Once | Status: AC
  Administered 2021-03-18: 1
  Filled 2021-03-18: qty 1

## 2021-03-18 MED ORDER — OSELTAMIVIR PHOSPHATE 75 MG PO CAPS: 75.0000 mg | ORAL_CAPSULE | Freq: Two times a day (BID) | ORAL | 0 refills | Status: DC

## 2021-03-18 MED ORDER — METHYLPREDNISOLONE SODIUM SUCC 125 MG IJ SOLR
125.0000 mg | Freq: Once | INTRAMUSCULAR | Status: AC
  Administered 2021-03-18: 125 mg via INTRAMUSCULAR
  Filled 2021-03-18: qty 2

## 2021-03-18 MED ORDER — IPRATROPIUM-ALBUTEROL 20-100 MCG/ACT IN AERS
1.0000 | INHALATION_SPRAY | Freq: Four times a day (QID) | RESPIRATORY_TRACT | Status: DC | PRN
  Filled 2021-03-18: qty 4

## 2021-03-18 MED ORDER — IPRATROPIUM-ALBUTEROL 0.5-2.5 (3) MG/3ML IN SOLN
3.0000 mL | Freq: Once | RESPIRATORY_TRACT | Status: AC
  Administered 2021-03-18: 3 mL via RESPIRATORY_TRACT
  Filled 2021-03-18: qty 3

## 2021-03-18 NOTE — ED Provider Notes (Signed)
[facility]ERGENC[facility] Provider Note: Lowella Dell, MD, FACEP  CSN: [id number] MRN: [id number] ARRIVAL: 03/18/21 at 0220 ROOM: DB014/DB014   CHIEF COMPLAINT  Shortness of Breath   HISTORY OF PRESENT ILLNESS  03/18/21 2:30 AM Taylor Mccann is a 35 y.o. female with a history of asthma.  She was treated for bilateral lower lobe pneumonia diagnosed on 02/16/2021.  She was treated with Zithromax.  She is here with cough and shortness of breath that began yesterday.  She does not know if she has had a fever with this.  She has had some nasal congestion.  Her shortness of breath was so severe she found it difficult to ambulate.  She self medicated with albuterol and ipratropium with improvement.  On arrival she is mildly tachypneic and tachycardic.   Past Medical History:  Diagnosis Date   Asthma    Depression    Emphysema with chronic bronchitis (HCC)    Frequent headaches    H/O blood clots     Past Surgical History:  Procedure Laterality Date   CESAREAN SECTION      Family History  Problem Relation Age of Onset   Schizophrenia Mother    Bipolar disorder Mother    Diabetes Maternal Grandmother    Hypertension Maternal Grandmother     Social History   Tobacco Use   Smoking status: Never   Smokeless tobacco: Never  Substance Use Topics   Alcohol use: Yes   Drug use: Never    Prior to Admission medications   Medication Sig Start Date End Date Taking? Authorizing Provider  albuterol (VENTOLIN HFA) 108 (90 Base) MCG/ACT inhaler Inhale 2 puffs into the lungs every 6 (six) hours as needed for wheezing or shortness of breath. 11/10/20  Yes Deeann Saint, MD  oseltamivir (TAMIFLU) 75 MG capsule Take 1 capsule (75 mg total) by mouth every 12 (twelve) hours. 03/18/21  Yes Tannia Contino, MD  predniSONE (DELTASONE) 20 MG tablet 2 tabs po daily x 3 days 03/18/21  Yes Shawnta Zimbelman, MD    Allergies Penicillins and Penicillins   REVIEW OF SYSTEMS  Negative except as  noted here or in the History of Present Illness.   PHYSICAL EXAMINATION  Initial Vital Signs Blood pressure 129/77, pulse (!) 123, temperature 99.2 F (37.3 C), temperature source Oral, resp. rate (!) 22, height 5\' 6"  (1.676 m), weight 113.4 kg, last menstrual period 02/21/2021, SpO2 96 %.  Examination General: Well-developed, well-nourished female in no acute distress; appearance consistent with age of record HENT: normocephalic; atraumatic Eyes: Normal appearance Neck: supple Heart: regular rate and rhythm; tachycardia Lungs: clear to auscultation bilaterally; mild tachypnea Abdomen: soft; nondistended; nontender; bowel sounds present Extremities: No deformity; full range of motion; pulses normal Neurologic: Awake, alert and oriented; motor function intact in all extremities and symmetric; no facial droop Skin: Warm and dry Psychiatric: Normal mood and affect   RESULTS  Summary of this visit's results, reviewed and interpreted by myself:   EKG Interpretation  Date/Time:    Ventricular Rate:    PR Interval:    QRS Duration:   QT Interval:    QTC Calculation:   R Axis:     Text Interpretation:         Laboratory Studies: Results for orders placed or performed during the hospital encounter of 03/18/21 (from the past 24 hour(s))  Resp Panel by RT-PCR (Flu A&B, Covid) Nasopharyngeal Swab     Status: Abnormal   Collection Time: 03/18/21  2:33 AM  Specimen: Nasopharyngeal Swab; Nasopharyngeal(NP) swabs in vial transport medium  Result Value Ref Range   SARS Coronavirus 2 by RT PCR NEGATIVE NEGATIVE   Influenza A by PCR POSITIVE (A) NEGATIVE   Influenza B by PCR NEGATIVE NEGATIVE   Imaging Studies: DG Chest 2 View  Result Date: 03/18/2021 CLINICAL DATA:  Shortness of breath. EXAM: CHEST - 2 VIEW COMPARISON:  Chest radiograph dated 02/16/2021. FINDINGS: No focal consolidation, pleural effusion, pneumothorax. The cardiac silhouette is within limits. No acute osseous  pathology. IMPRESSION: No active cardiopulmonary disease. Electronically Signed   By: Elgie Collard M.D.   On: 03/18/2021 02:44    ED COURSE and MDM  Nursing notes, initial and subsequent vitals signs, including pulse oximetry, reviewed and interpreted by myself.  Vitals:   03/18/21 0225 03/18/21 0228 03/18/21 0228 03/18/21 0300  BP: 129/77 129/77  107/60  Pulse: (!) 127 (!) 123  (!) 113  Resp: (!) 23 (!) 22  (!) 21  Temp: 99.2 F (37.3 C)     TempSrc: Oral     SpO2: 96% 97% 96% 98%  Weight:      Height:       Medications  oseltamivir (TAMIFLU) capsule 75 mg (has no administration in time range)  ipratropium-albuterol (DUONEB) 0.5-2.5 (3) MG/3ML nebulizer solution 3 mL (has no administration in time range)  methylPREDNISolone sodium succinate (SOLU-MEDROL) 125 mg/2 mL injection 125 mg (125 mg Intramuscular Given 03/18/21 0253)   3:22 AM Patient slightly wheezing now.  Will administer a DuoNeb treatment.  Patient started on steroids and will start on Tamiflu.   PROCEDURES  Procedures   ED DIAGNOSES     ICD-10-CM   1. Influenza A  J10.1     2. Acute bronchospasm due to viral infection  J98.01    B34.9          Siria Calandro, Jonny Ruiz, MD 03/18/21 [phone]

## 2021-03-18 NOTE — ED Triage Notes (Signed)
Shortness of breath x 2 days, cough. Denies pain. Unsure of fevers.

## 2021-03-18 NOTE — ED Notes (Signed)
RT educated pt on proper use of MDI w/spacer. Pt also advised to seek an appointment w/pulmonologist for PFT for evaluation/treatment of Asthma since she has never had a PFT. Pt expresses understanding of information.

## 2021-03-18 NOTE — ED Notes (Signed)
ED Provider at bedside. 

## 2021-03-20 ENCOUNTER — Ambulatory Visit: Payer: BC Managed Care – PPO | Admitting: Family Medicine

## 2021-05-26 ENCOUNTER — Encounter: Payer: Self-pay | Admitting: Family Medicine

## 2021-05-26 ENCOUNTER — Ambulatory Visit: Payer: BC Managed Care – PPO | Admitting: Family Medicine

## 2021-05-26 ENCOUNTER — Other Ambulatory Visit: Payer: Self-pay | Admitting: Family Medicine

## 2021-05-26 VITALS — BP 124/80 | HR 88 | Temp 98.0°F | Ht 66.0 in | Wt 260.2 lb

## 2021-05-26 DIAGNOSIS — E876 Hypokalemia: Secondary | ICD-10-CM

## 2021-05-26 DIAGNOSIS — R911 Solitary pulmonary nodule: Secondary | ICD-10-CM | POA: Diagnosis not present

## 2021-05-26 DIAGNOSIS — J4541 Moderate persistent asthma with (acute) exacerbation: Secondary | ICD-10-CM

## 2021-05-26 DIAGNOSIS — X088XXD Exposure to other specified smoke, fire and flames, subsequent encounter: Secondary | ICD-10-CM | POA: Diagnosis not present

## 2021-05-26 DIAGNOSIS — N182 Chronic kidney disease, stage 2 (mild): Secondary | ICD-10-CM | POA: Diagnosis not present

## 2021-05-26 LAB — BASIC METABOLIC PANEL
BUN: 16 mg/dL (ref 6–23)
CO2: 32 mEq/L (ref 19–32)
Calcium: 9.5 mg/dL (ref 8.4–10.5)
Chloride: 102 mEq/L (ref 96–112)
Creatinine, Ser: 1.24 mg/dL — ABNORMAL HIGH (ref 0.40–1.20)
GFR: 55.78 mL/min — ABNORMAL LOW (ref >60.00)
Glucose, Bld: 71 mg/dL (ref 70–99)
Potassium: 3.4 mEq/L — ABNORMAL LOW (ref 3.5–5.1)
Sodium: 138 mEq/L (ref 135–145)

## 2021-05-26 MED ORDER — POTASSIUM CHLORIDE CRYS ER 20 MEQ PO TBCR: 20.0000 meq | EXTENDED_RELEASE_TABLET | Freq: Every day | ORAL | 0 refills | Status: DC
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
## 2021-08-09 ENCOUNTER — Emergency Department (HOSPITAL_BASED_OUTPATIENT_CLINIC_OR_DEPARTMENT_OTHER)
Admission: EM | Admit: 2021-08-09 | Discharge: 2021-08-09 | Disposition: A | Discharge status: Home / Self Care | Payer: BC Managed Care – PPO | Attending: Emergency Medicine | Admitting: Emergency Medicine

## 2021-08-09 ENCOUNTER — Other Ambulatory Visit: Payer: Self-pay

## 2021-08-09 ENCOUNTER — Encounter (HOSPITAL_BASED_OUTPATIENT_CLINIC_OR_DEPARTMENT_OTHER): Payer: Self-pay | Admitting: Emergency Medicine

## 2021-08-09 ENCOUNTER — Emergency Department (HOSPITAL_BASED_OUTPATIENT_CLINIC_OR_DEPARTMENT_OTHER): Payer: BC Managed Care – PPO

## 2021-08-09 DIAGNOSIS — S86912A Strain of unspecified muscle(s) and tendon(s) at lower leg level, left leg, initial encounter: Secondary | ICD-10-CM | POA: Insufficient documentation

## 2021-08-09 DIAGNOSIS — Z8709 Personal history of other diseases of the respiratory system: Secondary | ICD-10-CM

## 2021-08-09 DIAGNOSIS — J45909 Unspecified asthma, uncomplicated: Secondary | ICD-10-CM | POA: Insufficient documentation

## 2021-08-09 DIAGNOSIS — Y92410 Unspecified street and highway as the place of occurrence of the external cause: Secondary | ICD-10-CM | POA: Insufficient documentation

## 2021-08-09 DIAGNOSIS — S8012XA Contusion of left lower leg, initial encounter: Secondary | ICD-10-CM | POA: Diagnosis present

## 2021-08-09 DIAGNOSIS — Z7952 Long term (current) use of systemic steroids: Secondary | ICD-10-CM | POA: Diagnosis not present

## 2021-08-09 DIAGNOSIS — R062 Wheezing: Secondary | ICD-10-CM

## 2021-08-09 DIAGNOSIS — T148XXA Other injury of unspecified body region, initial encounter: Secondary | ICD-10-CM

## 2021-08-09 MED ORDER — ACETAMINOPHEN 500 MG PO TABS
1000.0000 mg | ORAL_TABLET | Freq: Once | ORAL | Status: AC
  Administered 2021-08-09: 1000 mg via ORAL
  Filled 2021-08-09: qty 2

## 2021-08-09 MED ORDER — IPRATROPIUM-ALBUTEROL 0.5-2.5 (3) MG/3ML IN SOLN
RESPIRATORY_TRACT | Status: AC
  Administered 2021-08-09: 3 mL via RESPIRATORY_TRACT
  Filled 2021-08-09: qty 3

## 2021-08-09 MED ORDER — ALBUTEROL SULFATE (2.5 MG/3ML) 0.083% IN NEBU: 5.0000 mg | INHALATION_SOLUTION | Freq: Once | RESPIRATORY_TRACT | Status: DC

## 2021-08-09 MED ORDER — IPRATROPIUM-ALBUTEROL 0.5-2.5 (3) MG/3ML IN SOLN: 3.0000 mL | Freq: Once | RESPIRATORY_TRACT | Status: AC

## 2021-08-09 MED ORDER — ALBUTEROL SULFATE HFA 108 (90 BASE) MCG/ACT IN AERS: 2.0000 | INHALATION_SPRAY | RESPIRATORY_TRACT | 1 refills | Status: DC | PRN

## 2021-08-09 MED ORDER — METHOCARBAMOL 750 MG PO TABS: 750.0000 mg | ORAL_TABLET | Freq: Three times a day (TID) | ORAL | 0 refills | Status: DC | PRN

## 2021-08-09 MED ORDER — IPRATROPIUM BROMIDE 0.02 % IN SOLN: 0.5000 mg | Freq: Once | RESPIRATORY_TRACT | Status: DC
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
## 2021-08-09 NOTE — Discharge Instructions (Addendum)
It was our pleasure to provide your ER care today - we hope that you feel better. ? ?Take acetaminophen or ibuprofen as need for pain. You may also take robaxin as need for muscle pain/spasm - no driving when taking. ? ?Use albuterol treatment as need.  ? ?Follow up with primary care doctor in 1-2 weeks if symptoms fail to improve/resolve. Your blood pressure is mildly high today - follow up with primary care doctor.  ? ?Return to ER if worse, new symptoms, new/severe pain, trouble breathing, severe abdominal pain, or other concern.  ?
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
## 2021-09-20 ENCOUNTER — Ambulatory Visit: Payer: BC Managed Care – PPO | Admitting: Family Medicine

## 2021-09-21 ENCOUNTER — Ambulatory Visit: Payer: BC Managed Care – PPO | Admitting: Family Medicine

## 2021-11-13 ENCOUNTER — Ambulatory Visit (INDEPENDENT_AMBULATORY_CARE_PROVIDER_SITE_OTHER): Payer: Self-pay | Admitting: Family Medicine

## 2021-11-13 ENCOUNTER — Encounter: Payer: Self-pay | Admitting: Family Medicine

## 2021-11-13 VITALS — BP 130/90 | HR 65 | Temp 98.1°F | Ht 67.0 in | Wt 263.8 lb

## 2021-11-13 DIAGNOSIS — E559 Vitamin D deficiency, unspecified: Secondary | ICD-10-CM

## 2021-11-13 DIAGNOSIS — J4541 Moderate persistent asthma with (acute) exacerbation: Secondary | ICD-10-CM

## 2021-11-13 DIAGNOSIS — R030 Elevated blood-pressure reading, without diagnosis of hypertension: Secondary | ICD-10-CM

## 2021-11-13 DIAGNOSIS — Z0000 Encounter for general adult medical examination without abnormal findings: Secondary | ICD-10-CM

## 2021-11-13 DIAGNOSIS — E782 Mixed hyperlipidemia: Secondary | ICD-10-CM

## 2021-11-13 DIAGNOSIS — J302 Other seasonal allergic rhinitis: Secondary | ICD-10-CM

## 2021-11-13 LAB — BASIC METABOLIC PANEL
BUN: 9 mg/dL (ref 6–23)
CO2: 29 mEq/L (ref 19–32)
Calcium: 9.4 mg/dL (ref 8.4–10.5)
Chloride: 103 mEq/L (ref 96–112)
Creatinine, Ser: 1.15 mg/dL (ref 0.40–1.20)
GFR: 60.85 mL/min (ref >60.00)
Glucose, Bld: 94 mg/dL (ref 70–99)
Potassium: 4.1 mEq/L (ref 3.5–5.1)
Sodium: 138 mEq/L (ref 135–145)

## 2021-11-13 LAB — CBC WITH DIFFERENTIAL/PLATELET
Basophils Absolute: 0 10*3/uL (ref 0.0–0.1)
Basophils Relative: 1 % (ref 0.0–3.0)
Eosinophils Absolute: 0.3 10*3/uL (ref 0.0–0.7)
Eosinophils Relative: 6.8 % — ABNORMAL HIGH (ref 0.0–5.0)
HCT: 39.2 % (ref 36.0–46.0)
Hemoglobin: 13.3 g/dL (ref 12.0–15.0)
Lymphocytes Relative: 37 % (ref 12.0–46.0)
Lymphs Abs: 1.4 10*3/uL (ref 0.7–4.0)
MCHC: 33.9 g/dL (ref 30.0–36.0)
MCV: 84.5 fl (ref 78.0–100.0)
Monocytes Absolute: 0.2 10*3/uL (ref 0.1–1.0)
Monocytes Relative: 6.6 % (ref 3.0–12.0)
Neutro Abs: 1.8 10*3/uL (ref 1.4–7.7)
Neutrophils Relative %: 48.6 % (ref 43.0–77.0)
Platelets: 248 10*3/uL (ref 150.0–400.0)
RBC: 4.65 Mil/uL (ref 3.87–5.11)
RDW: 13.1 % (ref 11.5–15.5)
WBC: 3.7 10*3/uL — ABNORMAL LOW (ref 4.0–10.5)

## 2021-11-13 LAB — LIPID PANEL
Cholesterol: 213 mg/dL — ABNORMAL HIGH (ref 0–200)
HDL: 39.4 mg/dL (ref >39.00)
LDL Cholesterol: 149 mg/dL — ABNORMAL HIGH (ref 0–99)
NonHDL: 173.96
Total CHOL/HDL Ratio: 5
Triglycerides: 127 mg/dL (ref 0.0–149.0)
VLDL: 25.4 mg/dL (ref 0.0–40.0)

## 2021-11-13 LAB — T4, FREE: Free T4: 0.75 ng/dL (ref 0.60–1.60)

## 2021-11-13 LAB — TSH: TSH: 2.04 u[IU]/mL (ref 0.35–5.50)

## 2021-11-13 LAB — VITAMIN D 25 HYDROXY (VIT D DEFICIENCY, FRACTURES): VITD: 14.35 ng/mL — ABNORMAL LOW (ref 30.00–100.00)

## 2021-11-13 LAB — HEMOGLOBIN A1C: Hgb A1c MFr Bld: 5.4 % (ref 4.6–6.5)

## 2021-11-13 MED ORDER — ALBUTEROL SULFATE HFA 108 (90 BASE) MCG/ACT IN AERS: 2.0000 | INHALATION_SPRAY | Freq: Four times a day (QID) | RESPIRATORY_TRACT | 6 refills | Status: DC | PRN

## 2021-11-13 MED ORDER — BUDESONIDE-FORMOTEROL FUMARATE 80-4.5 MCG/ACT IN AERO: 2.0000 | INHALATION_SPRAY | Freq: Two times a day (BID) | RESPIRATORY_TRACT | 5 refills | Status: DC

## 2021-11-13 MED ORDER — FLUTICASONE PROPIONATE 50 MCG/ACT NA SUSP: 1.0000 | Freq: Every day | NASAL | 4 refills | Status: DC

## 2021-11-13 NOTE — Patient Instructions (Addendum)
Prescription for Symbicort inhaler was sent to the pharmacy.  Take 1 puff twice a day to see if this helps with your asthma symptoms.  A prescription for Flonase nasal spray was also sent to your pharmacy.

## 2021-11-13 NOTE — Progress Notes (Signed)
Subjective:     Taylor Mccann is a 36 y.o. female and is here for a comprehensive physical exam. The patient reports increased asthma symptoms such as wheezing, noticed more at night.  Will drink water to see if things improved.  Using albuterol inhaler as needed.  Patient taking Xyzal for allergy symptoms.  In the past with wiexla caused weight gain.  Social History   Socioeconomic History   Marital status: Significant Other    Spouse name: Not on file   Number of children: Not on file   Years of education: Not on file   Highest education level: Master's degree (e.g., MA, MS, MEng, MEd, MSW, MBA)  Occupational History   Not on file  Tobacco Use   Smoking status: Never   Smokeless tobacco: Never  Substance and Sexual Activity   Alcohol use: Never   Drug use: Never   Sexual activity: Not on file  Other Topics Concern   Not on file  Social History Narrative   Not on file   Social Determinants of Health   Financial Resource Strain: Low Risk  (03/17/2021)   Overall Financial Resource Strain (CARDIA)    Difficulty of Paying Living Expenses: Not hard at all  Food Insecurity: Food Insecurity Present (03/17/2021)   Hunger Vital Sign    Worried About Running Out of Food in the Last Year: Sometimes true    Ran Out of Food in the Last Year: Sometimes true  Transportation Needs: No Transportation Needs (03/17/2021)   PRAPARE - Administrator, Civil Service (Medical): No    Lack of Transportation (Non-Medical): No  Physical Activity: Insufficiently Active (03/17/2021)   Exercise Vital Sign    Days of Exercise per Week: 2 days    Minutes of Exercise per Session: 60 min  Stress: No Stress Concern Present (03/17/2021)   [employer] of Occupational Health - Occupational Stress Questionnaire    Feeling of Stress : Only a little  Social Connections: Moderately Integrated (03/17/2021)   Social Connection and Isolation Panel [NHANES]    Frequency of Communication  with Friends and Family: More than three times a week    Frequency of Social Gatherings with Friends and Family: Never    Attends Religious Services: Never    Database administrator or Organizations: Yes    Attends Engineer, structural: More than 4 times per year    Marital Status: Living with partner  Intimate Partner Violence: Not on file   Health Maintenance  Topic Date Due   COVID-19 Vaccine (4 - Moderna series) 11/23/2021 (Originally 04/10/2020)   Hepatitis C Screening  11/23/2021 (Originally 05/08/2002)   HIV Screening  11/23/2021 (Originally 05/09/1999)   INFLUENZA VACCINE  11/21/2021   PAP SMEAR-Modifier  09/07/2022   TETANUS/TDAP  09/06/2029   HPV VACCINES  Aged Out    The following portions of the patient's history were reviewed and updated as appropriate: allergies, current medications, past family history, past medical history, past social history, past surgical history, and problem list.  Review of Systems Pertinent items noted in HPI and remainder of comprehensive ROS otherwise negative.   Objective:    BP 130/90 (BP Location: Left Arm, Cuff Size: Large)   Pulse 65   Temp 98.1 F (36.7 C) (Oral)   Ht 5\' 7"  (1.702 m)   Wt 263 lb 12.8 oz (119.7 kg)   LMP 11/02/2021 (Exact Date)   SpO2 98%   BMI 41.32 kg/m  General appearance: alert, cooperative,  and no distress Head: Normocephalic, without obvious abnormality, atraumatic Eyes: conjunctivae/corneas clear. PERRL, EOM's intact. Fundi benign. Ears: normal TM's and external ear canals both ears Nose:  External nares normal, septum midline, mild bogginess and edema of turbinates causing narrowing within internal naris. Throat: lips, mucosa, and tongue normal; teeth and gums normal Neck: no adenopathy, no carotid bruit, no JVD, supple, symmetrical, trachea midline, and thyroid not enlarged, symmetric, no tenderness/mass/nodules Lungs: clear to auscultation bilaterally Heart: regular rate and rhythm, S1, S2  normal, no murmur, click, rub or gallop Abdomen: soft, non-tender; bowel sounds normal; no masses,  no organomegaly Extremities: extremities normal, atraumatic, no cyanosis or edema Pulses: 2+ and symmetric Skin: Skin color, texture, turgor normal. No rashes or lesions Lymph nodes: Cervical, supraclavicular, and axillary nodes normal. Neurologic: Alert and oriented X 3, normal strength and tone. Normal symmetric reflexes. Normal coordination and gait    Assessment:    Healthy female exam with increased asthma symptoms.   Plan:    Anticipatory guidance given including wearing seatbelts, smoke detectors in the home, increasing physical activity, increasing p.o. intake of water and vegetables. -We will obtain labs -Mammogram and colonoscopy not yet indicated 2/2 age -Last pap 09/09/2019.  Next Pap 2026 -Immunizations reviewed -Given handout -Next CPE in 1 year See After Visit Summary for Counseling Recommendations   - Plan: Basic metabolic panel, TSH, T4, Free, Hemoglobin A1c, Lipid panel  Moderate persistent asthma with acute exacerbation -Increased asthma symptoms at night -In the past Wiexla cause weight gain -We will start Symbicort daily maintenance inhaler. -Continue albuterol inhaler as needed -discussed using Flonase along with Xyzal to help decrease allergy symptoms which are likely contributing to increased asthma symptoms.  - Plan: albuterol (VENTOLIN HFA) 108 (90 Base) MCG/ACT inhaler, fluticasone (FLONASE) 50 MCG/ACT nasal spray, budesonide-formoterol (SYMBICORT) 80-4.5 MCG/ACT inhaler  Seasonal allergies -Likely causing postnasal drainage which is contributing to increased allergy symptoms -Continue Xyzal -Flonase as needed - Plan: CBC with Differential/Platelet, fluticasone (FLONASE) 50 MCG/ACT nasal spray  Mixed hyperlipidemia  -Total cholesterol 217, LDL 156, triglycerides 95, HDL 41.2 on 11/10/2020 -Lifestyle modifications - Plan: Lipid panel  Elevated blood  pressure reading without diagnosis of hypertension  -120/90 -Recheck BP -Lifestyle modifications including decreasing sodium intake and increasing p.o. intake of water -We will continue to monitor.   - Plan: Basic metabolic panel, TSH, T4, Free  Vitamin D deficiency -Vitamin D 10.34 on 11/10/2020 - Plan: Vitamin D, 25-hydroxy  F/u in 3 months for asthma/allergies and BP check  Abbe Amsterdam, MD

## 2021-11-17 ENCOUNTER — Other Ambulatory Visit: Payer: Self-pay | Admitting: Family Medicine

## 2021-11-17 DIAGNOSIS — E559 Vitamin D deficiency, unspecified: Secondary | ICD-10-CM

## 2021-11-17 MED ORDER — VITAMIN D (ERGOCALCIFEROL) 1.25 MG (50000 UNIT) PO CAPS: 50000.0000 [IU] | ORAL_CAPSULE | ORAL | 0 refills | Status: DC

## 2021-12-20 ENCOUNTER — Ambulatory Visit: Payer: Self-pay | Admitting: Family Medicine

## 2022-04-12 ENCOUNTER — Ambulatory Visit (INDEPENDENT_AMBULATORY_CARE_PROVIDER_SITE_OTHER): Payer: 59 | Admitting: Internal Medicine

## 2022-04-12 ENCOUNTER — Telehealth: Payer: 59 | Admitting: Emergency Medicine

## 2022-04-12 VITALS — BP 128/90 | HR 80 | Temp 98.1°F | Wt 277.9 lb

## 2022-04-12 DIAGNOSIS — J069 Acute upper respiratory infection, unspecified: Secondary | ICD-10-CM

## 2022-04-12 DIAGNOSIS — J029 Acute pharyngitis, unspecified: Secondary | ICD-10-CM | POA: Diagnosis not present

## 2022-04-12 DIAGNOSIS — J4541 Moderate persistent asthma with (acute) exacerbation: Secondary | ICD-10-CM | POA: Diagnosis not present

## 2022-04-12 DIAGNOSIS — R059 Cough, unspecified: Secondary | ICD-10-CM | POA: Diagnosis not present

## 2022-04-12 LAB — POCT INFLUENZA A/B
Influenza A, POC: NEGATIVE
Influenza B, POC: NEGATIVE

## 2022-04-12 LAB — POC COVID19 BINAXNOW: SARS Coronavirus 2 Ag: NEGATIVE

## 2022-04-12 LAB — POCT RAPID STREP A (OFFICE): Rapid Strep A Screen: NEGATIVE

## 2022-04-12 MED ORDER — PREDNISONE 10 MG (21) PO TBPK: ORAL_TABLET | ORAL | 0 refills | Status: DC

## 2022-04-12 MED ORDER — ALBUTEROL SULFATE HFA 108 (90 BASE) MCG/ACT IN AERS: 2.0000 | INHALATION_SPRAY | Freq: Four times a day (QID) | RESPIRATORY_TRACT | 6 refills | Status: DC | PRN

## 2022-04-12 MED ORDER — AZELASTINE HCL 0.1 % NA SOLN: 2.0000 | Freq: Two times a day (BID) | NASAL | 0 refills | Status: DC

## 2022-04-12 MED ORDER — BENZONATATE 100 MG PO CAPS: 100.0000 mg | ORAL_CAPSULE | Freq: Two times a day (BID) | ORAL | 0 refills | Status: DC | PRN

## 2022-04-12 NOTE — Progress Notes (Signed)

## 2022-04-12 NOTE — Progress Notes (Signed)
Established Patient Office Visit     CC/Reason for Visit: Cough, shortness of breath  HPI: Taylor Mccann is a 36 y.o. female who is coming in today for the above mentioned reasons. Past Medical History is significant for: Asthma.  For the past 4 days she has been having increasing shortness of breath.  She has also had a runny nose, sore throat and increased postnasal drip.  For the past 2 days she has been experiencing significant shortness of breath to where she is using her albuterol frequently.  No fever, no sick contacts.   Past Medical/Surgical History: Past Medical History:  Diagnosis Date   Asthma    Depression    Emphysema with chronic bronchitis    Frequent headaches    H/O blood clots     Past Surgical History:  Procedure Laterality Date   CESAREAN SECTION      Social History:  reports that she has never smoked. She has never used smokeless tobacco. She reports that she does not drink alcohol and does not use drugs.  Allergies: Allergies  Allergen Reactions   Penicillins    Penicillins     Family History:  Family History  Problem Relation Age of Onset   Schizophrenia Mother    Bipolar disorder Mother    Diabetes Maternal Grandmother    Hypertension Maternal Grandmother      Current Outpatient Medications:    azelastine (ASTELIN) 0.1 % nasal spray, Place 2 sprays into both nostrils 2 (two) times daily. Use in each nostril as directed, Disp: 30 mL, Rfl: 0   fluticasone (FLONASE) 50 MCG/ACT nasal spray, Place 1 spray into both nostrils daily., Disp: 16 g, Rfl: 4   predniSONE (STERAPRED UNI-PAK 21 TAB) 10 MG (21) TBPK tablet, Take as directed, Disp: 21 tablet, Rfl: 0   albuterol (VENTOLIN HFA) 108 (90 Base) MCG/ACT inhaler, Inhale 2 puffs into the lungs every 6 (six) hours as needed for wheezing or shortness of breath., Disp: 18 g, Rfl: 6   benzonatate (TESSALON) 100 MG capsule, Take 1 capsule (100 mg total) by mouth 2 (two) times daily as  needed for cough. (Patient not taking: Reported on 04/12/2022), Disp: 20 capsule, Rfl: 0   budesonide-formoterol (SYMBICORT) 80-4.5 MCG/ACT inhaler, Inhale 2 puffs into the lungs 2 (two) times daily. (Patient not taking: Reported on 04/12/2022), Disp: 1 each, Rfl: 5   methocarbamol (ROBAXIN) 750 MG tablet, Take 1 tablet (750 mg total) by mouth 3 (three) times daily as needed (muscle spasm/pain). (Patient not taking: Reported on 11/13/2021), Disp: 15 tablet, Rfl: 0   Vitamin D, Ergocalciferol, (DRISDOL) 1.25 MG (50000 UNIT) CAPS capsule, Take 1 capsule (50,000 Units total) by mouth every 7 (seven) days. (Patient not taking: Reported on 04/12/2022), Disp: 12 capsule, Rfl: 0  Review of Systems:  Constitutional: Denies fever, chills, diaphoresis, appetite change and fatigue.  HEENT: Denies photophobia, eye pain, redness,  mouth sores, trouble swallowing, neck pain, neck stiffness and tinnitus.   Respiratory: Denies  chest tightness,  and wheezing.   Cardiovascular: Denies chest pain, palpitations and leg swelling.  Gastrointestinal: Denies nausea, vomiting, abdominal pain, diarrhea, constipation, blood in stool and abdominal distention.  Genitourinary: Denies dysuria, urgency, frequency, hematuria, flank pain and difficulty urinating.  Endocrine: Denies: hot or cold intolerance, sweats, changes in hair or nails, polyuria, polydipsia. Musculoskeletal: Denies myalgias, back pain, joint swelling, arthralgias and gait problem.  Skin: Denies pallor, rash and wound.  Neurological: Denies dizziness, seizures, syncope, weakness, light-headedness, numbness and  headaches.  Hematological: Denies adenopathy. Easy bruising, personal or family bleeding history  Psychiatric/Behavioral: Denies suicidal ideation, mood changes, confusion, nervousness, sleep disturbance and agitation    Physical Exam: Vitals:   04/12/22 1412 04/12/22 1414  BP: (!) 145/85 (!) 128/90  Pulse: 80   Temp: 98.1 F (36.7 C)   TempSrc:  Oral   SpO2: 98%   Weight: 277 lb 14.4 oz (126.1 kg)     Body mass index is 43.53 kg/m.   Constitutional: NAD, calm, comfortable Eyes: PERRL, lids and conjunctivae normal, wears corrective lenses ENMT: Mucous membranes are moist.  Respiratory: Significant inspiratory and expiratory wheezing.  Normal respiratory effort. No accessory muscle use.  Cardiovascular: Regular rate and rhythm, no murmurs / rubs / gallops. No extremity edema.   Psychiatric: Normal judgment and insight. Alert and oriented x 3. Normal mood.    Impression and Plan:  Sore throat - Plan: POC COVID-19 BinaxNow, POC Influenza A/B, POC Rapid Strep A  Cough, unspecified type - Plan: POC COVID-19 BinaxNow, POC Influenza A/B, POC Rapid Strep A  Moderate persistent asthma with exacerbation - Plan: predniSONE (STERAPRED UNI-PAK 21 TAB) 10 MG (21) TBPK tablet  Moderate persistent asthma with acute exacerbation - Plan: albuterol (VENTOLIN HFA) 108 (90 Base) MCG/ACT inhaler  -In office flu, COVID, strep tests have been negative. -Suspect an unspecified viral URI has flared up her asthma as she is currently wheezing today. -We have discussed a prednisone taper which she agrees to.  I will also refill her albuterol. -Have advised that she schedule follow-up with her PCP in 2 weeks for reevaluation.  Time spent:31 minutes reviewing chart, interviewing and examining patient and formulating plan of care.      Chaya Jan, MD [facility]

## 2022-04-27 ENCOUNTER — Ambulatory Visit: Payer: 59 | Admitting: Family Medicine

## 2022-06-22 ENCOUNTER — Telehealth: Payer: Self-pay

## 2022-06-22 NOTE — Telephone Encounter (Signed)
Fyi  Received document from pt insurance's [employer]. Stating they will no longer covered ALBUTEROLPA INH HFA 200 without prior authorization starting April 1.   To please consider prescribing preferred medications when appropriate.   Preferred medication(s): Albuterol sulfate aerosol (except NDCs [id number], [id number]) Levalbuterol tartrate aerosol

## 2022-08-23 ENCOUNTER — Ambulatory Visit: Payer: 59 | Admitting: Family Medicine

## 2022-08-24 ENCOUNTER — Encounter: Payer: Self-pay | Admitting: Family Medicine

## 2022-08-24 ENCOUNTER — Telehealth (INDEPENDENT_AMBULATORY_CARE_PROVIDER_SITE_OTHER): Payer: Self-pay | Admitting: Family Medicine

## 2022-08-24 VITALS — Ht 67.0 in | Wt 278.0 lb

## 2022-08-24 DIAGNOSIS — J4520 Mild intermittent asthma, uncomplicated: Secondary | ICD-10-CM

## 2022-08-24 DIAGNOSIS — Z7689 Persons encountering health services in other specified circumstances: Secondary | ICD-10-CM

## 2022-08-24 DIAGNOSIS — Z6841 Body Mass Index (BMI) 40.0 and over, adult: Secondary | ICD-10-CM

## 2022-08-24 NOTE — Progress Notes (Signed)
Virtual Visit via Video Note  I connected with Taylor Mccann on 08/24/22 at  8:30 AM EDT by a video enabled telemedicine application and verified that I am speaking with the correct person using two identifiers.  Location patient: home Location provider:work or home office Persons participating in the virtual visit: patient, provider  I discussed the limitations of evaluation and management by telemedicine and the availability of in person appointments. The patient expressed understanding and agreed to proceed. Chief Complaint  Patient presents with   Weight Loss     HPI:  Pt is a 37 yo female with pmh sig for obesity, asthma, history of PE, HLD, seasonal allergies, vitamin D deficiency who was seen for follow-up.  Patient interested in weight loss patients.  Does not want to use injectable medications or have surgery.  Patient notes increase in weight after falling December 2023 injuring her ankle.  Patient was unable to exercise and found herself eating more due to feeling depressed.  Pt now 278 lbs. Lowest weight 160 pounds.  Eating salads, cooking at home, bring lunch to work.  May eat 2 meals as may forget.  Having dinner around 7 or 7:30 pm may be 8 or 9 if fiance is cooking.  Drinking 2 yetti cups of water, coffee. Using [employer].  Increased wt causing SOB and joint pain.  Pt tried phentermine in the past.  Lost wt, but couldn't remember to take it and had some insomnia.  Having to use inhaler more due to the pollen.  ROS: See pertinent positives and negatives per HPI.  Past Medical History:  Diagnosis Date   Asthma    Depression    Emphysema with chronic bronchitis    Frequent headaches    H/O blood clots     Past Surgical History:  Procedure Laterality Date   CESAREAN SECTION      Family History  Problem Relation Age of Onset   Schizophrenia Mother    Bipolar disorder Mother    Diabetes Maternal Grandmother    Hypertension Maternal Grandmother       Current  Outpatient Medications:    albuterol (VENTOLIN HFA) 108 (90 Base) MCG/ACT inhaler, Inhale 2 puffs into the lungs every 6 (six) hours as needed for wheezing or shortness of breath., Disp: 18 g, Rfl: 6   fluticasone (FLONASE) 50 MCG/ACT nasal spray, Place 1 spray into both nostrils daily., Disp: 16 g, Rfl: 4   azelastine (ASTELIN) 0.1 % nasal spray, Place 2 sprays into both nostrils 2 (two) times daily. Use in each nostril as directed (Patient not taking: Reported on 08/24/2022), Disp: 30 mL, Rfl: 0   budesonide-formoterol (SYMBICORT) 80-4.5 MCG/ACT inhaler, Inhale 2 puffs into the lungs 2 (two) times daily. (Patient not taking: Reported on 08/24/2022), Disp: 1 each, Rfl: 5   Vitamin D, Ergocalciferol, (DRISDOL) 1.25 MG (50000 UNIT) CAPS capsule, Take 1 capsule (50,000 Units total) by mouth every 7 (seven) days. (Patient not taking: Reported on 08/24/2022), Disp: 12 capsule, Rfl: 0  EXAM:  VITALS per patient if applicable: Ht 5\' 7"  (1.702 m)   Wt 278 lb (126.1 kg)   BMI 43.54 kg/m    GENERAL: alert, oriented, appears well and in no acute distress  HEENT: atraumatic, conjunctiva clear, no obvious abnormalities on inspection of external nose and ears  NECK: normal movements of the head and neck  LUNGS: on inspection no signs of respiratory distress, breathing rate appears normal, no obvious gross SOB, gasping or wheezing  CV: no obvious cyanosis  MS: moves all visible extremities without noticeable abnormality  PSYCH/NEURO: pleasant and cooperative, no obvious depression or anxiety, speech and thought processing grossly intact  ASSESSMENT AND PLAN:  Discussed the following assessment and plan:  Encounter for weight management - Plan: Amb Ref to Medical Weight Management  Class 3 severe obesity due to excess calories with serious comorbidity and body mass index (BMI) of 40.0 to 44.9 in adult [facility]) - Plan: Amb Ref to Medical Weight Management  Mild intermittent asthma without  complication  Discussed being consistent with lifestyle modifications.  Patient to exercise on Peloton 3 times per week at a minimum.  Food log.  Set alarms and phone to remind patient to do activities.  Patient will check with company regarding which weight loss medications are covered.  Referral to weight management placed.  Continue albuterol inhaler as needed and OTC antihistamine.   I discussed the assessment and treatment plan with the patient. The patient was provided an opportunity to ask questions and all were answered. The patient agreed with the plan and demonstrated an understanding of the instructions.   The patient was advised to call back or seek an in-person evaluation if the symptoms worsen or if the condition fails to improve as anticipated.  Follow-up in 2-3 months.,  Sooner if needed Deeann Saint, MD

## 2023-01-16 ENCOUNTER — Encounter: Payer: Self-pay | Admitting: Family Medicine

## 2023-03-08 ENCOUNTER — Ambulatory Visit (INDEPENDENT_AMBULATORY_CARE_PROVIDER_SITE_OTHER): Payer: BC Managed Care – PPO | Admitting: Family Medicine

## 2023-03-08 VITALS — BP 120/90 | HR 87 | Temp 98.2°F | Ht 66.54 in | Wt 220.6 lb

## 2023-03-08 DIAGNOSIS — E559 Vitamin D deficiency, unspecified: Secondary | ICD-10-CM | POA: Diagnosis not present

## 2023-03-08 DIAGNOSIS — Z23 Encounter for immunization: Secondary | ICD-10-CM | POA: Diagnosis not present

## 2023-03-08 DIAGNOSIS — Z0000 Encounter for general adult medical examination without abnormal findings: Secondary | ICD-10-CM | POA: Diagnosis not present

## 2023-03-08 DIAGNOSIS — E66812 Obesity, class 2: Secondary | ICD-10-CM

## 2023-03-08 DIAGNOSIS — E782 Mixed hyperlipidemia: Secondary | ICD-10-CM

## 2023-03-08 DIAGNOSIS — Z6835 Body mass index (BMI) 35.0-35.9, adult: Secondary | ICD-10-CM | POA: Diagnosis not present

## 2023-03-08 DIAGNOSIS — J4520 Mild intermittent asthma, uncomplicated: Secondary | ICD-10-CM | POA: Diagnosis not present

## 2023-03-08 DIAGNOSIS — Z1159 Encounter for screening for other viral diseases: Secondary | ICD-10-CM

## 2023-03-08 LAB — CBC WITH DIFFERENTIAL/PLATELET
Basophils Absolute: 0 10*3/uL (ref 0.0–0.1)
Basophils Relative: 0.9 % (ref 0.0–3.0)
Eosinophils Absolute: 0.3 10*3/uL (ref 0.0–0.7)
Eosinophils Relative: 7.9 % — ABNORMAL HIGH (ref 0.0–5.0)
HCT: 40.9 % (ref 36.0–46.0)
Hemoglobin: 13.6 g/dL (ref 12.0–15.0)
Lymphocytes Relative: 35.1 % (ref 12.0–46.0)
Lymphs Abs: 1.2 10*3/uL (ref 0.7–4.0)
MCHC: 33.2 g/dL (ref 30.0–36.0)
MCV: 86.1 fL (ref 78.0–100.0)
Monocytes Absolute: 0.3 10*3/uL (ref 0.1–1.0)
Monocytes Relative: 7.3 % (ref 3.0–12.0)
Neutro Abs: 1.7 10*3/uL (ref 1.4–7.7)
Neutrophils Relative %: 48.8 % (ref 43.0–77.0)
Platelets: 254 10*3/uL (ref 150.0–400.0)
RBC: 4.75 Mil/uL (ref 3.87–5.11)
RDW: 13 % (ref 11.5–15.5)
WBC: 3.5 10*3/uL — ABNORMAL LOW (ref 4.0–10.5)

## 2023-03-08 LAB — COMPREHENSIVE METABOLIC PANEL
ALT: 11 U/L (ref 0–35)
AST: 14 U/L (ref 0–37)
Albumin: 4 g/dL (ref 3.5–5.2)
Alkaline Phosphatase: 56 U/L (ref 39–117)
BUN: 8 mg/dL (ref 6–23)
CO2: 29 meq/L (ref 19–32)
Calcium: 9.4 mg/dL (ref 8.4–10.5)
Chloride: 103 meq/L (ref 96–112)
Creatinine, Ser: 1.05 mg/dL (ref 0.40–1.20)
GFR: 67.25 mL/min (ref >60.00)
Glucose, Bld: 76 mg/dL (ref 70–99)
Potassium: 3.5 meq/L (ref 3.5–5.1)
Sodium: 139 meq/L (ref 135–145)
Total Bilirubin: 0.7 mg/dL (ref 0.2–1.2)
Total Protein: 7.4 g/dL (ref 6.0–8.3)

## 2023-03-08 LAB — VITAMIN B12: Vitamin B-12: 1341 pg/mL — ABNORMAL HIGH (ref 211–911)

## 2023-03-08 LAB — LIPID PANEL
Cholesterol: 203 mg/dL — ABNORMAL HIGH (ref 0–200)
HDL: 36.1 mg/dL — ABNORMAL LOW (ref >39.00)
LDL Cholesterol: 152 mg/dL — ABNORMAL HIGH (ref 0–99)
NonHDL: 166.64
Total CHOL/HDL Ratio: 6
Triglycerides: 71 mg/dL (ref 0.0–149.0)
VLDL: 14.2 mg/dL (ref 0.0–40.0)

## 2023-03-08 LAB — TSH: TSH: 2.09 u[IU]/mL (ref 0.35–5.50)

## 2023-03-08 LAB — HEMOGLOBIN A1C: Hgb A1c MFr Bld: 4.9 % (ref 4.6–6.5)

## 2023-03-08 LAB — VITAMIN D 25 HYDROXY (VIT D DEFICIENCY, FRACTURES): VITD: 12.22 ng/mL — ABNORMAL LOW (ref 30.00–100.00)

## 2023-03-08 MED ORDER — ALBUTEROL SULFATE HFA 108 (90 BASE) MCG/ACT IN AERS: 2.0000 | INHALATION_SPRAY | Freq: Four times a day (QID) | RESPIRATORY_TRACT | 6 refills | Status: AC | PRN

## 2023-03-08 NOTE — Progress Notes (Signed)
Established Patient Office Visit   Subjective  Patient ID: Taylor Mccann, female    DOB: [birth year]  Age: 37 y.o. MRN: [id number]  Chief Complaint  Patient presents with   Annual Exam    Patient is a 37 year old female seen for CPE.  Pt seen by weight management.  Started on semaglutide 2.4 mg yesterday.  Receives injections at the clinic.  Exercising at the gym and on Peloton.  Patient lost 78 pounds since visit on 08/24/2022.  Patient requesting refill on albuterol inhaler.  States started having symptoms recently.    Patient Active Problem List   Diagnosis Date Noted   Vitamin D deficiency 11/11/2020   Class 3 severe obesity due to excess calories without serious comorbidity with body mass index (BMI) of 40.0 to 44.9 in adult [facility]) 11/10/2020   Mixed hyperlipidemia 11/10/2020   History of pulmonary embolus (PE) 10/02/2018   Seasonal allergies 10/02/2018   Mild intermittent asthma without complication 10/02/2018   Past Medical History:  Diagnosis Date   Asthma    Depression    Emphysema with chronic bronchitis (HCC)    Frequent headaches    H/O blood clots    Past Surgical History:  Procedure Laterality Date   CESAREAN SECTION     Social History   Tobacco Use   Smoking status: Never   Smokeless tobacco: Never  Substance Use Topics   Alcohol use: Never   Drug use: Never   Family History  Problem Relation Age of Onset   Schizophrenia Mother    Bipolar disorder Mother    Diabetes Maternal Grandmother    Hypertension Maternal Grandmother    Allergies  Allergen Reactions   Etonogestrel-Ethinyl Estradiol Other (See Comments)    Pulmonary embolism  Pulmonary embolism  Pulmonary embolism  Pulmonary embolism Pulmonary embolism   Penicillins Other (See Comments) and Rash    Child hood reaction  Pt is unsure  Child hood reaction  Child hood reaction  Pt is unsure  Child hood reaction  Other reaction(s): Unknown  Child hood reaction  Pt is  unsure  Child hood reaction  Pt is unsure  Child hood reaction  Child hood reaction  Child hood reaction, Pt is unsure  unkown   Other Other (See Comments)   Penicillins       ROS Negative unless stated above    Objective:     BP (!) 120/90 (BP Location: Right Arm, Patient Position: Sitting, Cuff Size: Large)   Pulse 87   Temp 98.2 F (36.8 C) (Oral)   Ht 5' 6.54" (1.69 m)   Wt 220 lb 9.6 oz (100.1 kg)   LMP 02/20/2023 (Exact Date)   SpO2 98%   BMI 35.04 kg/m  BP Readings from Last 3 Encounters:  03/08/23 (!) 120/90  04/12/22 (!) 128/90  11/13/21 130/90   Wt Readings from Last 3 Encounters:  03/08/23 220 lb 9.6 oz (100.1 kg)  08/24/22 278 lb (126.1 kg)  04/12/22 277 lb 14.4 oz (126.1 kg)      Physical Exam Constitutional:      Appearance: Normal appearance.  HENT:     Head: Normocephalic and atraumatic.     Right Ear: Tympanic membrane, ear canal and external ear normal.     Left Ear: Tympanic membrane, ear canal and external ear normal.     Nose: Nose normal.     Mouth/Throat:     Mouth: Mucous membranes are moist.     Pharynx: No oropharyngeal exudate or posterior  oropharyngeal erythema.  Eyes:     General: No scleral icterus.    Extraocular Movements: Extraocular movements intact.     Conjunctiva/sclera: Conjunctivae normal.     Pupils: Pupils are equal, round, and reactive to light.  Neck:     Thyroid: No thyromegaly.  Cardiovascular:     Rate and Rhythm: Normal rate and regular rhythm.     Pulses: Normal pulses.     Heart sounds: Normal heart sounds. No murmur heard.    No friction rub.  Pulmonary:     Effort: Pulmonary effort is normal.     Breath sounds: Normal breath sounds. No wheezing, rhonchi or rales.  Abdominal:     General: Bowel sounds are normal.     Palpations: Abdomen is soft.     Tenderness: There is no abdominal tenderness.  Musculoskeletal:        General: No deformity. Normal range of motion.  Lymphadenopathy:      Cervical: No cervical adenopathy.  Skin:    General: Skin is warm and dry.     Findings: No lesion.  Neurological:     General: No focal deficit present.     Mental Status: She is alert and oriented to person, place, and time.  Psychiatric:        Mood and Affect: Mood normal.        Thought Content: Thought content normal.       03/08/2023    8:51 AM 04/12/2022    3:03 PM 11/13/2021   12:01 PM  Depression screen PHQ 2/9  Decreased Interest 0 0 0  Down, Depressed, Hopeless 0 0 0  PHQ - 2 Score 0 0 0  Altered sleeping 0 0 0  Tired, decreased energy 0 1 0  Change in appetite 0 2 0  Feeling bad or failure about yourself  0 0 0  Trouble concentrating 0 0 0  Moving slowly or fidgety/restless 0 0 0  Suicidal thoughts 0 0 0  PHQ-9 Score 0 3 0  Difficult doing work/chores Not difficult at all Not difficult at all Not difficult at all      03/08/2023    8:51 AM 11/10/2020    9:09 AM 09/07/2019    2:49 PM  GAD 7 : Generalized Anxiety Score  Nervous, Anxious, on Edge 1 2 1   Control/stop worrying 1 2 3   Worry too much - different things 1 2 3   Trouble relaxing 1 2 3   Restless 0 2 3  Easily annoyed or irritable 0 0 2  Afraid - awful might happen 0 0 2  Total GAD 7 Score 4 10 17   Anxiety Difficulty Not difficult at all Not difficult at all Somewhat difficult   No results found for any visits on 03/08/23.    Assessment & Plan:  Well adult exam -Age-appropriate health screenings discussed -Obtain labs -Immunizations reviewed.  Influenza vaccine given this visit -Pap up-to-date done 09/09/2019.  Repeat in 2026. -Next CPE in 1 year -     CBC with Differential/Platelet; Future -     Hemoglobin A1c; Future -     TSH; Future  Mixed hyperlipidemia -     CBC with Differential/Platelet; Future -     Comprehensive metabolic panel; Future -     Lipid panel; Future  Screening for hep C and low risk individual       -     Hep C antibody  Vitamin D deficiency -     VITAMIN D 25  Hydroxy (Vit-D Deficiency, Fractures); Future  Moderate persistent asthma with acute exacerbation -     CBC with Differential/Platelet; Future -     Albuterol Sulfate HFA; Inhale 2 puffs into the lungs every 6 (six) hours as needed for wheezing or shortness of breath.  Dispense: 18 g; Refill: 6  Need for influenza vaccination  Class 2 severe obesity due to excess calories with serious comorbidity and body mass index (BMI) of 35.0 to 35.9 in adult (HCC) -Body mass index is 35.04 kg/m. -Continue lifestyle modifications -Continue semaglutide 2.4 mg weekly with weight management clinic -     Comprehensive metabolic panel; Future -     Hemoglobin A1c; Future -     TSH; Future -     Vitamin B12; Future -     VITAMIN D 25 Hydroxy (Vit-D Deficiency, Fractures); Future   Return in about 1 year (around 03/07/2024) for physical.  sooner if needed for other concerns.Deeann Saint, MD

## 2023-03-09 LAB — HEPATITIS C ANTIBODY: Hepatitis C Ab: NONREACTIVE

## 2023-03-11 ENCOUNTER — Other Ambulatory Visit: Payer: Self-pay | Admitting: Family Medicine

## 2023-03-11 DIAGNOSIS — E559 Vitamin D deficiency, unspecified: Secondary | ICD-10-CM

## 2023-03-11 MED ORDER — VITAMIN D (ERGOCALCIFEROL) 1.25 MG (50000 UNIT) PO CAPS: 50000.0000 [IU] | ORAL_CAPSULE | ORAL | 0 refills | Status: DC

## 2023-04-28 ENCOUNTER — Telehealth: Payer: 59 | Admitting: Physician Assistant

## 2023-04-28 DIAGNOSIS — J019 Acute sinusitis, unspecified: Secondary | ICD-10-CM

## 2023-04-28 DIAGNOSIS — B9689 Other specified bacterial agents as the cause of diseases classified elsewhere: Secondary | ICD-10-CM

## 2023-04-29 MED ORDER — DOXYCYCLINE HYCLATE 100 MG PO TABS: 100.0000 mg | ORAL_TABLET | Freq: Two times a day (BID) | ORAL | 0 refills | Status: DC

## 2023-04-29 NOTE — Progress Notes (Signed)

## 2023-05-21 ENCOUNTER — Encounter (INDEPENDENT_AMBULATORY_CARE_PROVIDER_SITE_OTHER): Payer: Self-pay

## 2023-05-29 ENCOUNTER — Encounter (INDEPENDENT_AMBULATORY_CARE_PROVIDER_SITE_OTHER): Payer: Self-pay

## 2023-06-10 ENCOUNTER — Encounter: Payer: Self-pay | Admitting: Family Medicine

## 2023-06-10 ENCOUNTER — Telehealth (INDEPENDENT_AMBULATORY_CARE_PROVIDER_SITE_OTHER): Payer: 59 | Admitting: Family Medicine

## 2023-06-10 DIAGNOSIS — R41840 Attention and concentration deficit: Secondary | ICD-10-CM | POA: Diagnosis not present

## 2023-06-10 DIAGNOSIS — F419 Anxiety disorder, unspecified: Secondary | ICD-10-CM | POA: Diagnosis not present

## 2023-06-10 NOTE — Progress Notes (Signed)
 Virtual Visit via Video Note  I connected with Taylor Mccann on 06/10/23 at  2:00 PM EST by a video enabled telemedicine application and verified that I am speaking with the correct person using two identifiers.  Location patient: home Location provider:work or home office Persons participating in the virtual visit: patient, provider  I discussed the limitations of evaluation and management by telemedicine and the availability of in person appointments. The patient expressed understanding and agreed to proceed.  Chief Complaint  Patient presents with   ADHD    Pt's therapist recommended meds    HPI: Patient is a 38 year old female seen for follow-up/ongoing concern.  Pt started seeing a therapist, Tova Woullard at [facility] therapy weekly.  Patient feels like she needs more than 1 session per week.  States her therapist dx'd her with PTSD, anxiety and ADHD.  Patient was advised to ask PCP about medication.  Patient states diagnoses were based on a questionnaire completed.  Pt states her mind never rests.  Even when trying to rest she is thinking about things.  Pt states she is easily distracted and forgets to complete task at work.  Patient denies depression.  ROS: See pertinent positives and negatives per HPI.  Past Medical History:  Diagnosis Date   Asthma    Depression    Emphysema with chronic bronchitis (HCC)    Frequent headaches    H/O blood clots     Past Surgical History:  Procedure Laterality Date   CESAREAN SECTION      Family History  Problem Relation Age of Onset   Schizophrenia Mother    Bipolar disorder Mother    Diabetes Maternal Grandmother    Hypertension Maternal Grandmother      Current Outpatient Medications:    acetaminophen (TYLENOL) 500 MG tablet, Take by mouth., Disp: , Rfl:    albuterol (VENTOLIN HFA) 108 (90 Base) MCG/ACT inhaler, Inhale 2 puffs into the lungs every 6 (six) hours as needed for wheezing or shortness of breath., Disp: 18 g, Rfl: 6    azelastine (ASTELIN) 0.1 % nasal spray, Place 2 sprays into both nostrils 2 (two) times daily. Use in each nostril as directed, Disp: 30 mL, Rfl: 0   cetirizine (ZYRTEC) 10 MG tablet, Take by mouth., Disp: , Rfl:    doxycycline (VIBRA-TABS) 100 MG tablet, Take 1 tablet (100 mg total) by mouth 2 (two) times daily., Disp: 20 tablet, Rfl: 0   fluticasone (FLONASE) 50 MCG/ACT nasal spray, Place 1 spray into both nostrils daily., Disp: 16 g, Rfl: 4   montelukast (SINGULAIR) 10 MG tablet, Take by mouth., Disp: , Rfl:    naproxen (NAPROSYN) 250 MG tablet, Take by mouth., Disp: , Rfl:    Semaglutide-Weight Management 2.4 MG/0.75ML SOAJ, Inject 2.4 mg into the skin once a week., Disp: , Rfl:    Vitamin D, Ergocalciferol, (DRISDOL) 1.25 MG (50000 UNIT) CAPS capsule, Take 1 capsule (50,000 Units total) by mouth every 7 (seven) days., Disp: 12 capsule, Rfl: 0  EXAM:  VITALS per patient if applicable:   RR between 12-20 bpm  GENERAL: alert, oriented, appears well and in no acute distress  HEENT: atraumatic, conjunctiva clear, no obvious abnormalities on inspection of external nose and ears  NECK: normal movements of the head and neck  LUNGS: on inspection no signs of respiratory distress, breathing rate appears normal, no obvious gross SOB, gasping or wheezing  CV: no obvious cyanosis  MS: moves all visible extremities without noticeable abnormality  PSYCH/NEURO: pleasant and  cooperative, no obvious depression or anxiety, speech and thought processing grossly intact   ASSESSMENT AND PLAN:  Discussed the following assessment and plan:  Inattention  Anxiety  Patient with new problem.  Increased anxiety and attention which is affecting performance at work.  Advised to continue counseling.  Due to concern for ADHD formal testing recommended.  Patient given various BH providers in town that offer formal testing.  Further recommendations based on results.  Consider Zoloft or other medication for  anxiety.  Follow-up as needed  I discussed the assessment and treatment plan with the patient. The patient was provided an opportunity to ask questions and all were answered. The patient agreed with the plan and demonstrated an understanding of the instructions.   The patient was advised to call back or seek an in-person evaluation if the symptoms worsen or if the condition fails to improve as anticipated.   Deeann Saint, MD

## 2023-06-13 ENCOUNTER — Telehealth: Payer: 59 | Admitting: Family Medicine

## 2023-08-27 ENCOUNTER — Other Ambulatory Visit: Payer: Self-pay | Admitting: Family Medicine

## 2023-08-27 DIAGNOSIS — J302 Other seasonal allergic rhinitis: Secondary | ICD-10-CM

## 2023-08-27 DIAGNOSIS — J4541 Moderate persistent asthma with (acute) exacerbation: Secondary | ICD-10-CM
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
## 2024-03-12 ENCOUNTER — Encounter: Payer: Self-pay | Admitting: Family Medicine

## 2024-03-12 ENCOUNTER — Ambulatory Visit: Admitting: Family Medicine

## 2024-03-12 VITALS — BP 112/84 | HR 75 | Temp 98.4°F | Ht 66.65 in | Wt 185.6 lb

## 2024-03-12 DIAGNOSIS — Z23 Encounter for immunization: Secondary | ICD-10-CM

## 2024-03-12 DIAGNOSIS — Z0000 Encounter for general adult medical examination without abnormal findings: Secondary | ICD-10-CM

## 2024-03-12 DIAGNOSIS — E559 Vitamin D deficiency, unspecified: Secondary | ICD-10-CM

## 2024-03-12 DIAGNOSIS — E782 Mixed hyperlipidemia: Secondary | ICD-10-CM

## 2024-03-12 LAB — LIPID PANEL
Cholesterol: 187 mg/dL (ref 0–200)
HDL: 43 mg/dL (ref >39.00)
LDL Cholesterol: 132 mg/dL — ABNORMAL HIGH (ref 0–99)
NonHDL: 144.44
Total CHOL/HDL Ratio: 4
Triglycerides: 63 mg/dL (ref 0.0–149.0)
VLDL: 12.6 mg/dL (ref 0.0–40.0)

## 2024-03-12 LAB — COMPREHENSIVE METABOLIC PANEL WITH GFR
ALT: 11 U/L (ref 0–35)
AST: 14 U/L (ref 0–37)
Albumin: 3.9 g/dL (ref 3.5–5.2)
Alkaline Phosphatase: 45 U/L (ref 39–117)
BUN: 7 mg/dL (ref 6–23)
CO2: 30 meq/L (ref 19–32)
Calcium: 9.3 mg/dL (ref 8.4–10.5)
Chloride: 104 meq/L (ref 96–112)
Creatinine, Ser: 0.85 mg/dL (ref 0.40–1.20)
GFR: 86.05 mL/min (ref >60.00)
Glucose, Bld: 74 mg/dL (ref 70–99)
Potassium: 3.7 meq/L (ref 3.5–5.1)
Sodium: 138 meq/L (ref 135–145)
Total Bilirubin: 0.6 mg/dL (ref 0.2–1.2)
Total Protein: 7.1 g/dL (ref 6.0–8.3)

## 2024-03-12 LAB — VITAMIN D 25 HYDROXY (VIT D DEFICIENCY, FRACTURES): VITD: 22.37 ng/mL — ABNORMAL LOW (ref 30.00–100.00)

## 2024-03-12 LAB — CBC WITH DIFFERENTIAL/PLATELET
Basophils Absolute: 0 K/uL (ref 0.0–0.1)
Basophils Relative: 1.2 % (ref 0.0–3.0)
Eosinophils Absolute: 0.1 K/uL (ref 0.0–0.7)
Eosinophils Relative: 5.4 % — ABNORMAL HIGH (ref 0.0–5.0)
HCT: 38 % (ref 36.0–46.0)
Hemoglobin: 12.7 g/dL (ref 12.0–15.0)
Lymphocytes Relative: 46.1 % — ABNORMAL HIGH (ref 12.0–46.0)
Lymphs Abs: 1.3 K/uL (ref 0.7–4.0)
MCHC: 33.4 g/dL (ref 30.0–36.0)
MCV: 85.6 fl (ref 78.0–100.0)
Monocytes Absolute: 0.2 K/uL (ref 0.1–1.0)
Monocytes Relative: 7.7 % (ref 3.0–12.0)
Neutro Abs: 1.1 K/uL — ABNORMAL LOW (ref 1.4–7.7)
Neutrophils Relative %: 39.6 % — ABNORMAL LOW (ref 43.0–77.0)
Platelets: 231 K/uL (ref 150.0–400.0)
RBC: 4.44 Mil/uL (ref 3.87–5.11)
RDW: 13.1 % (ref 11.5–15.5)
WBC: 2.7 K/uL — ABNORMAL LOW (ref 4.0–10.5)

## 2024-03-12 LAB — TSH: TSH: 1.81 u[IU]/mL (ref 0.35–5.50)

## 2024-03-12 LAB — HEMOGLOBIN A1C: Hgb A1c MFr Bld: 4.5 % — ABNORMAL LOW (ref 4.6–6.5)

## 2024-03-12 LAB — T4, FREE: Free T4: 0.84 ng/dL (ref 0.60–1.60)

## 2024-03-12 NOTE — Progress Notes (Signed)
 Established Patient Office Visit   Subjective  Patient ID: Taylor Mccann, female    DOB: 30-Sep-1985  Age: 38 y.o. MRN: [id number]  Chief Complaint  Patient presents with   Annual Exam    Pt is a 38 yo female seen for CPE.  Pt had a banana earlier today.  States is doing well.   Mood, sleep, and energy are good.  Appetite is decreased due to Ozempic for wt loss.  May have a protein shake for breakfast then eat something for lunch.  May have dinner or something light if hungry in the evening.  Lost 90 lbs.  Currently 185 lbs, goal wt 160 lbs.  Plans to stop med in Dec.    Exercising several times per week.    Patient Active Problem List   Diagnosis Date Noted   Vitamin D  deficiency 11/11/2020   Class 3 severe obesity due to excess calories without serious comorbidity with body mass index (BMI) of 40.0 to 44.9 in adult [facility]) 11/10/2020   Mixed hyperlipidemia 11/10/2020   History of pulmonary embolus (PE) 10/02/2018   Seasonal allergies 10/02/2018   Mild intermittent asthma without complication 10/02/2018   Past Medical History:  Diagnosis Date   Asthma    Depression    Emphysema with chronic bronchitis (HCC)    Frequent headaches    H/O blood clots    Past Surgical History:  Procedure Laterality Date   CESAREAN SECTION     Social History   Tobacco Use   Smoking status: Never   Smokeless tobacco: Never  Substance Use Topics   Alcohol use: Not Currently    Comment: Every once in a while. Not regularly   Drug use: Never   Family History  Problem Relation Age of Onset   Schizophrenia Mother    Bipolar disorder Mother    Diabetes Maternal Grandmother    Hypertension Maternal Grandmother    Allergies  Allergen Reactions   Etonogestrel-Ethinyl Estradiol Other (See Comments)    Pulmonary embolism  Pulmonary embolism  Pulmonary embolism  Pulmonary embolism Pulmonary embolism   Penicillins Other (See Comments) and Rash    Child hood reaction  Pt is  unsure  Child hood reaction  Child hood reaction  Pt is unsure  Child hood reaction  Other reaction(s): Unknown  Child hood reaction  Pt is unsure  Child hood reaction  Pt is unsure  Child hood reaction  Child hood reaction  Child hood reaction, Pt is unsure  unkown   Other Other (See Comments)   Penicillins     ROS Negative unless stated above    Objective:     BP 112/84 (BP Location: Right Arm, Patient Position: Sitting, Cuff Size: Large)   Pulse 75   Temp 98.4 F (36.9 C) (Oral)   Ht 5' 6.65 (1.693 m)   Wt 185 lb 9.6 oz (84.2 kg)   LMP 03/03/2024 (Exact Date)   SpO2 99%   BMI 29.38 kg/m  BP Readings from Last 3 Encounters:  03/12/24 112/84  03/08/23 (!) 120/90  04/12/22 (!) 128/90   Wt Readings from Last 3 Encounters:  03/12/24 185 lb 9.6 oz (84.2 kg)  03/08/23 220 lb 9.6 oz (100.1 kg)  08/24/22 278 lb (126.1 kg)      Physical Exam Constitutional:      Appearance: Normal appearance.  HENT:     Head: Normocephalic and atraumatic.     Right Ear: Tympanic membrane, ear canal and external ear normal.  Left Ear: Tympanic membrane, ear canal and external ear normal.     Nose: Nose normal.     Mouth/Throat:     Mouth: Mucous membranes are moist.     Pharynx: No oropharyngeal exudate or posterior oropharyngeal erythema.  Eyes:     General: No scleral icterus.    Extraocular Movements: Extraocular movements intact.     Conjunctiva/sclera: Conjunctivae normal.     Pupils: Pupils are equal, round, and reactive to light.  Neck:     Thyroid : No thyromegaly.     Vascular: No carotid bruit.  Cardiovascular:     Rate and Rhythm: Normal rate and regular rhythm.     Pulses: Normal pulses.     Heart sounds: Normal heart sounds. No murmur heard.    No friction rub.  Pulmonary:     Effort: Pulmonary effort is normal.     Breath sounds: Normal breath sounds. No wheezing, rhonchi or rales.  Abdominal:     General: Bowel sounds are normal.      Palpations: Abdomen is soft.     Tenderness: There is no abdominal tenderness.  Musculoskeletal:        General: No deformity. Normal range of motion.  Lymphadenopathy:     Cervical: No cervical adenopathy.  Skin:    General: Skin is warm and dry.     Findings: No lesion.  Neurological:     General: No focal deficit present.     Mental Status: She is alert and oriented to person, place, and time.  Psychiatric:        Mood and Affect: Mood normal.        Thought Content: Thought content normal.        03/08/2023    8:51 AM 04/12/2022    3:03 PM 11/13/2021   12:01 PM  Depression screen PHQ 2/9  Decreased Interest 0 0 0  Down, Depressed, Hopeless 0 0 0  PHQ - 2 Score 0 0 0  Altered sleeping 0 0 0  Tired, decreased energy 0 1 0  Change in appetite 0 2 0  Feeling bad or failure about yourself  0 0 0  Trouble concentrating 0 0 0  Moving slowly or fidgety/restless 0 0 0  Suicidal thoughts 0 0 0  PHQ-9 Score 0  3  0   Difficult doing work/chores Not difficult at all Not difficult at all Not difficult at all     Data saved with a previous flowsheet row definition      03/08/2023    8:51 AM 11/10/2020    9:09 AM 09/07/2019    2:49 PM  GAD 7 : Generalized Anxiety Score  Nervous, Anxious, on Edge 1 2 1   Control/stop worrying 1 2 3   Worry too much - different things 1 2 3   Trouble relaxing 1 2 3   Restless 0 2 3  Easily annoyed or irritable 0 0 2  Afraid - awful might happen 0 0 2  Total GAD 7 Score 4 10 17   Anxiety Difficulty Not difficult at all Not difficult at all Somewhat difficult     No results found for any visits on 03/12/24.    Assessment & Plan:   Well adult exam -     CBC with Differential/Platelet; Future -     Comprehensive metabolic panel with GFR; Future -     TSH; Future -     T4, free; Future -     Lipid panel; Future -  Hemoglobin A1c; Future  Vitamin D  deficiency -     VITAMIN D  25 Hydroxy (Vit-D Deficiency, Fractures); Future  Mixed  hyperlipidemia -     Lipid panel; Future  Need for influenza vaccination -     Flu vaccine trivalent PF, 6mos and older(Flulaval,Afluria,Fluarix,Fluzone)  Age-appropriate health screenings discussed.  Obtain labs.  Immunizations reviewed.  Obtain labs.  Immunizations reviewed.  Influenza vaccine given this visit.  Mammogram not yet indicated 2/2 age.  Pap done 09/07/2019.  Repeat in 2026.  Continue lifestyle modifications including diet and exercise changes.  Congratulated on 90 lbs weight loss.  On semaglutide through outside provider.  Follow-up in 1 year for next CPE, sooner if needed.  Return in about 1 year (around 03/12/2025) for physical.   Clotilda JONELLE Single, MD

## 2024-03-27 ENCOUNTER — Ambulatory Visit: Payer: Self-pay | Admitting: Family Medicine

## 2024-03-27 DIAGNOSIS — D729 Disorder of white blood cells, unspecified: Secondary | ICD-10-CM

## 2024-03-27 DIAGNOSIS — E559 Vitamin D deficiency, unspecified: Secondary | ICD-10-CM

## 2024-03-27 MED ORDER — VITAMIN D (ERGOCALCIFEROL) 1.25 MG (50000 UNIT) PO CAPS: 50000.0000 [IU] | ORAL_CAPSULE | ORAL | 0 refills | Status: AC

## 2024-03-27 NOTE — Addendum Note (Signed)
 Addended by: BRIEN SONG A on: 03/27/2024 02:22 PM   Modules accepted: Orders

## 2024-03-30 ENCOUNTER — Other Ambulatory Visit: Payer: Self-pay | Admitting: Family Medicine

## 2024-03-30 DIAGNOSIS — D72818 Other decreased white blood cell count: Secondary | ICD-10-CM

## 2024-05-08 ENCOUNTER — Telehealth: Admitting: Physician Assistant

## 2024-05-08 DIAGNOSIS — B3731 Acute candidiasis of vulva and vagina: Secondary | ICD-10-CM

## 2024-05-08 MED ORDER — FLUCONAZOLE 150 MG PO TABS: 150.0000 mg | ORAL_TABLET | Freq: Once | ORAL | 0 refills | Status: AC

## 2024-05-08 NOTE — Progress Notes (Signed)
 Message sent to patient requesting further input regarding current symptoms. Awaiting patient response.

## 2024-05-08 NOTE — Progress Notes (Signed)
